# Patient Record
Sex: Female | Born: 1960 | Race: Black or African American | Hispanic: No | Marital: Single | Smoking: Never smoker
Health system: Southern US, Community
[De-identification: ages and names within clinical notes are randomized; demographics above are authoritative.]

## PROBLEM LIST (undated history)

## (undated) DIAGNOSIS — G4733 Obstructive sleep apnea (adult) (pediatric): Secondary | ICD-10-CM

## (undated) DIAGNOSIS — E119 Type 2 diabetes mellitus without complications: Secondary | ICD-10-CM

## (undated) DIAGNOSIS — E669 Obesity, unspecified: Secondary | ICD-10-CM

## (undated) DIAGNOSIS — R911 Solitary pulmonary nodule: Secondary | ICD-10-CM

## (undated) DIAGNOSIS — E063 Autoimmune thyroiditis: Secondary | ICD-10-CM

## (undated) DIAGNOSIS — R7303 Prediabetes: Secondary | ICD-10-CM

## (undated) DIAGNOSIS — E079 Disorder of thyroid, unspecified: Secondary | ICD-10-CM

## (undated) DIAGNOSIS — IMO0002 Reserved for concepts with insufficient information to code with codable children: Secondary | ICD-10-CM

## (undated) DIAGNOSIS — R42 Dizziness and giddiness: Secondary | ICD-10-CM

## (undated) DIAGNOSIS — E039 Hypothyroidism, unspecified: Secondary | ICD-10-CM

## (undated) HISTORY — DX: Reserved for concepts with insufficient information to code with codable children: IMO0002

## (undated) HISTORY — DX: Type 2 diabetes mellitus without complications: E11.9

## (undated) HISTORY — DX: Disorder of thyroid, unspecified: E07.9

## (undated) HISTORY — PX: TONSILLECTOMY: SUR1361

## (undated) HISTORY — DX: Autoimmune thyroiditis: E06.3

## (undated) HISTORY — DX: Morbid (severe) obesity due to excess calories: E66.01

---

## 1998-10-07 DIAGNOSIS — IMO0002 Reserved for concepts with insufficient information to code with codable children: Secondary | ICD-10-CM

## 1998-10-07 DIAGNOSIS — R87619 Unspecified abnormal cytological findings in specimens from cervix uteri: Secondary | ICD-10-CM

## 1998-10-07 HISTORY — DX: Reserved for concepts with insufficient information to code with codable children: IMO0002

## 1998-10-07 HISTORY — DX: Unspecified abnormal cytological findings in specimens from cervix uteri: R87.619

## 2002-03-02 HISTORY — PX: LAPAROSCOPIC TUBAL LIGATION: SUR803

## 2004-08-22 ENCOUNTER — Emergency Department: Payer: Self-pay | Admitting: Emergency Medicine

## 2005-10-01 ENCOUNTER — Ambulatory Visit: Payer: Self-pay | Admitting: Gynecology

## 2005-12-06 ENCOUNTER — Ambulatory Visit: Payer: Self-pay | Admitting: Family Medicine

## 2006-08-17 ENCOUNTER — Encounter: Payer: Self-pay | Admitting: Obstetrics & Gynecology

## 2006-08-17 ENCOUNTER — Ambulatory Visit: Payer: Self-pay | Admitting: Obstetrics & Gynecology

## 2007-11-02 ENCOUNTER — Ambulatory Visit: Payer: Self-pay | Admitting: Gynecology

## 2007-11-02 ENCOUNTER — Encounter (INDEPENDENT_AMBULATORY_CARE_PROVIDER_SITE_OTHER): Payer: Self-pay | Admitting: Gynecology

## 2008-01-18 ENCOUNTER — Ambulatory Visit: Payer: Self-pay

## 2008-11-14 ENCOUNTER — Ambulatory Visit: Payer: Self-pay | Admitting: Obstetrics & Gynecology

## 2008-11-14 ENCOUNTER — Encounter: Payer: Self-pay | Admitting: Obstetrics & Gynecology

## 2009-11-05 ENCOUNTER — Ambulatory Visit: Payer: Self-pay | Admitting: Obstetrics & Gynecology

## 2009-11-25 ENCOUNTER — Ambulatory Visit: Payer: Self-pay | Admitting: Obstetrics & Gynecology

## 2010-04-07 ENCOUNTER — Ambulatory Visit: Payer: Self-pay | Admitting: Obstetrics & Gynecology

## 2010-09-29 NOTE — Assessment & Plan Note (Signed)
NAME:  Krista Mclean, Krista Mclean NO.:  [id number]   MEDICAL RECORD NO.:  [id number]          PATIENT TYPE:  POB   LOCATION:  [facility] at [facility]         FACILITY:  [facility]   PHYSICIAN:  Ginger Carne, MD DATE OF BIRTH:  23-Nov-1960   DATE OF SERVICE:  11/02/2007                                  CLINIC NOTE   Krista Mclean is here today for routine gynecological evaluation.  She is a  multiparous African American female.  She had blood work performed at  her place of work, which demonstrated her TSH level to be 0.194, the  normal range is from 0.450-4.5.  Otherwise, her laboratory work appeared  normal apart from an elevated LDL of 109.  She states her menses are  regular.  No symptoms of urinary stress incontinence and she had a  normal mammogram 1 year ago.   CURRENT MEDICATIONS:  Levothyroxine 112 mcg 1 daily.  No change in her  medical history.  No cardiac, GI or GU complaints.   PHYSICAL EXAMINATION:  VITAL SIGNS:  Blood pressure 136/86, weight 163  pounds, height 5 feet 6 inches, pulse 76 and regular.  HEENT:  Grossly normal.  BREAST:  Without masses, discharge, thickenings or tenderness.  CHEST:  Clear to percussion and auscultation.  CARDIOVASCULAR:  Without murmurs or enlargements.  Regular rate and  rhythm.  EXTREMITY/LYMPHATIC/SKIN/NEUROLOGICAL/MUSCULOSKELETAL SYSTEMS:  Within  normal limits.  ABDOMEN:  Soft without gross hepatosplenomegaly.  PELVIC:  Pap smear performed.  EXTERNAL GENITALIA:  Vulva and vagina normal.  Cervix smooth without  erosions or lesions.  Uterus is normal-sized, anteverted and flexed.  Both adnexa are palpable and found to be normal.   IMPRESSION AND PLAN:  Levothyroxine will be reduced to 100 mcg daily and  repeat TSH in 6-8 weeks.  The patient understands further adjustments  may be necessary.  She will also be scheduled for a screening mammogram  in approximately 1 year.  No other recommendations at this time.  She  will return in  1 year for routine gynecological evaluation.           ______________________________  Ginger Carne, MD     SHB/MEDQ  D:  11/02/2007  T:  11/02/2007  Job:  [id number]

## 2010-09-29 NOTE — Assessment & Plan Note (Signed)
NAME:  Krista Mclean, Krista Mclean NO.:  [id number]   MEDICAL RECORD NO.:  [id number]          PATIENT TYPE:  POB   LOCATION:  [facility]         FACILITY:  [facility]   PHYSICIAN:  Scheryl Darter, MD       DATE OF BIRTH:  09/21/1960   DATE OF SERVICE:                                  CLINIC NOTE   The patient returns today for yearly exam.  The patient is a 50 year old  black female gravida 2, para 2, last menstrual period on November 02, 2008.  Her periods are regular and lasts for 4 days and are not heavy.  The  patient is status post tubal ligation.  She is in monogamous  relationship.   PAST MEDICAL HISTORY:  1. Hypothyroidism.  2. Morbid obesity.   PAST SURGICAL HISTORY:  1. Two cesarean sections and tubal ligation.  2. Tonsillectomy.   MEDICATIONS:  Levothyroxine 25 mcg p.o. daily.   ALLERGIES:  No known drug allergies.   SOCIAL HISTORY:  The patient is a single monogamous.  She denies  alcohol, tobacco, or drug use.   FAMILY HISTORY:  Negative for breast, gynecologic, or colon cancer.  There is a history of diabetes.   REVIEW OF SYSTEMS:  Irregular menses.  No complaints of pain, bowel, or  bladder complaints.   PHYSICAL EXAMINATION:  GENERAL:  The patient is in no acute distress and  affect is pleasant.  Weight is 262 pounds, height 5 feet and 6.  BMI is  greater than  42, blood pressure 129/78, and pulse is 78.  CHEST:  Clear.  HEART:  Regular rhythm.  BREASTS:  Without masses.  ABDOMEN:  Obese, soft, and nontender.  No mass.  EXTREMITIES:  No swelling.  PELVIC:  External genitalia, vagina, and cervix were normal.  No  abnormal discharge.  Pap was done.  Uterus is in normal size.  No  adnexal masses or tenderness.   IMPRESSION:  1. Normal gynecologic exam.  2. Morbid obesity.   PLAN:  I recommended increase exercise and dietary changes to have  weight loss.  I mentioned she is to try weight watchers.  She continues  to be followed by  endocrinologist for her hypothyroidism.  She will  schedule a screening mammogram.  The patient returns for yearly exams.      Scheryl Darter, MD     JA/MEDQ  D:  11/14/2008  T:  11/15/2008  Job:  [id number]

## 2010-09-29 NOTE — Assessment & Plan Note (Signed)
NAME:  Krista Mclean, Krista Mclean NO.:  [id number]   MEDICAL RECORD NO.:  [id number]          PATIENT TYPE:  POB   LOCATION:  [facility]         FACILITY:  [facility]   PHYSICIAN:  Scheryl Darter, MD       DATE OF BIRTH:  06/26/1960   DATE OF SERVICE:                                  CLINIC NOTE   The patient comes today for her yearly examination.  The patient is a 50-  year-old black female, gravida 2, para 2, last menstrual period November 12, 2009.  She has regular menses that last about 4 days and are not heavy.  She is status post tubal ligation.   PAST MEDICAL HISTORY:  1. Hypothyroidism.  2. Morbid obesity.   PAST SURGICAL HISTORY:  1. Two cesarean sections and tubal ligation.  2. Tonsillectomy.   MEDICATIONS:  Levothyroxine 125 mcg daily.  This is prescribed by her  family doctor.   No known drug allergies.   SOCIAL HISTORY:  The patient is single and monogamous.  She denies  alcohol, tobacco, or drug use.   FAMILY HISTORY:  Negative for breast, gynecologic, or colon cancer.  There is diabetes in the family.   REVIEW OF SYSTEMS:  Regular menses.  No complaints of chest pain,  shortness of breath.  She says that she only sleeps about 3-4 hours a  night and this is based on her habits rather than insomnia.  No bladder,  bowel, or vaginal complaints.   PHYSICAL EXAMINATION:  GENERAL:  No acute distress.  Normal affect.  VITAL SIGNS:  Weight is 253 pounds, height 5 feet 6 inches, blood  pressure is 131/82, pulse 81.  CHEST:  Clear.  HEART:  Regular rate and rhythm.  No thyromegaly noted.  BREASTS:  Symmetric.  No mass.  She has a bulging in both axillae which  appear to be fat pads.  No fluctuance or mass.  ABDOMEN:  Obese, soft, nontender.  No mass.  EXTREMITIES:  Some slight swelling below the knees.  PELVIC:  External genitalia, vagina and cervix appeared normal.  Pap was  done.  Uterus normal size.  No adnexal masses or tenderness.   IMPRESSION:  1. Normal gynecologic exam.  2. History of hypothyroidism.  3. Morbid obesity, poor health habits including lack of exercise and      inadequate sleep.   PLAN:  She is to have her TSH checked today and report about it to her  family doctor.  She will schedule screening mammogram.  Reassured her  there appeared to be no concerning findings on her breasts or axillary  exam.  She should continue to try to lose weight.  Recommend she  increase her sleep to at least 7 hours a night.  She should increase  exercise and decrease her dietary intake of calories.  She will return  for yearly exams.      Scheryl Darter, MD     JA/MEDQ  D:  11/25/2009  T:  11/25/2009  Job:  [id number]

## 2010-10-02 NOTE — Assessment & Plan Note (Signed)
[facility]                             [facility] OFFICE NOTE   NAME:Krista Mclean, Krista Mclean                          MRN:          [id number]  DATE:12/06/2005                            DOB:          [birth year]    Krista Mclean is a 50 year old black female who comes to establish with the  practice due to pain in the back of her head since November 25, 2005.  She was  seen at an urgent care on November 25, 2005, and was given Celebrex 200 mg daily  for 10 days, which has worked; however, she is running out of this.  She was  told that she had an inflamed nerve in her scalp.  This has continued,  though at a lesser intensity.  She was referred to this office through the  courtesy of Dr. Mia Creek.   She indicates that she sees Dr. Mia Creek on a regular basis and is up to  date with her Pap smear, with the most recent being May 2007, and mammogram  near the same time.   CURRENT MEDICATIONS:  1.  Synthroid 0.112 mg daily, which is regulated by Dr. Mia [place].  2.  Her p.r.n. Celebrex.   ALLERGIES:  No known drug allergies.   ALLERGIES:  None known.   PAST MEDICAL HISTORY:  Hypothyroidism.  She is unsure of her chicken pox  status.  She has had no other major illnesses.   SURGICAL HISTORY:  Tonsillectomy at age 89 and two C-sections.   She has been hospitalized for childbirth only.   SOCIAL HISTORY:  She is single and has two children, ages 57 and 13.  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  She has never  smoked, does not use alcohol or street drugs.   REVIEW OF SYSTEMS:  She denies any HEENT, cardiovascular, respiratory, GI or  GU problems.  She has an OSHA hearing test on a yearly basis, and these have  been negative.  She has also had negative PPD skin test years ago.  She had  HIV testing three years ago, which was negative at the time of her  pregnancy.  Gynecologically, she is a gravida 2, para 2.  She has had no  complications with her  pregnancies with the exception of the first being a  breech and therefore leading to the C-section.  MUSCULOSKELETAL:  She  indicates that she has pain in her hands, feet and knees.  Has had no  fractures.  No allergic rhinitis and no thromboses.   FAMILY HISTORY:  Father died between the ages of 38 and 58 due to a heart  problem.  She is unsure of what this was.  He had had open heart surgery,  was on dialysis and had RA.  Her mother is living at age 59 with diabetes,  hypertension, hyperlipidemia and RA.  She has two half-brothers, both living  and well, and four half-sisters, living and well.   For questions regarding the extended family, find that a paternal aunt has a  weak heart.  there  are no MIs or CVAs to her knowledge in the family.  She  has one aunt with hypertension, no other chronic disease noted in the  family.   IMMUNIZATION RECORD:  Her last tetanus was March 1999.  She has not had the  hepatitis B vaccine.   PHYSICAL EXAMINATION:  VITAL SIGNS:  Blood pressure was 119/93, temperature  98.4, pulse was 80, weight is 247 pounds.  Height 5 feet 5 inches.  GENERAL:  Obese black female in no acute distress.  HEENT:  TMs clear.  Posterior pharynx clear.  Nasal mucosa pink.  Facial  sinuses are nontender.  NECK:  No lymphadenopathy noted in the neck or the posterior scalp.  CHEST:  Clear throughout.  No rales, rhonchi or wheezes.  CARDIAC:  Rate and rhythm regular without murmurs, gallops or rubs.  MUSCULOSKELETAL:  Muscle mass is equal, upper and lower extremities.  Gait  and station within normal limits.  She has full range of motion of her neck  with minimal tenderness with forward flexion up the back of her neck and  onto the posterior scalp.  SKIN:  There are no lesions noted on the scalp.  NEUROLOGIC:  Cranial nerves II-XII intact as tested.  PSYCHIATRIC:  Oriented x3.  Verbalizes easily.  A good historian.   ASSESSMENT:  Scalp pain, probably muscular, extending into  the neck.   PLAN:  1.  She will complete the Celebrex that she has and then use either      ibuprofen 400 mg every four hours with food or Tylenol on a p.r.n.      basis.  Suspect that this is a short-lived problem.  2.  Obesity.  I have encouraged her to reduce her calories, increase her      exercise.  3.  Will see back on an as-needed basis.                                   Billie D. Bean, FNP                                Marne A. Tower, MD   BDB/MedQ  DD:  12/09/2005  DT:  12/09/2005  Job #:  [id number]

## 2010-10-09 ENCOUNTER — Encounter: Payer: Self-pay | Admitting: Podiatry

## 2010-12-01 ENCOUNTER — Ambulatory Visit (INDEPENDENT_AMBULATORY_CARE_PROVIDER_SITE_OTHER): Payer: PRIVATE HEALTH INSURANCE | Admitting: Family Medicine

## 2010-12-01 ENCOUNTER — Other Ambulatory Visit (HOSPITAL_COMMUNITY)
Admission: RE | Admit: 2010-12-01 | Discharge: 2010-12-01 | Disposition: A | Discharge status: Home / Self Care | Payer: PRIVATE HEALTH INSURANCE | Source: Ambulatory Visit | Attending: Family Medicine | Admitting: Family Medicine

## 2010-12-01 ENCOUNTER — Encounter: Payer: Self-pay | Admitting: Family Medicine

## 2010-12-01 VITALS — BP 121/83 | HR 74 | Ht 65.0 in | Wt 266.0 lb

## 2010-12-01 DIAGNOSIS — Z1272 Encounter for screening for malignant neoplasm of vagina: Secondary | ICD-10-CM

## 2010-12-01 DIAGNOSIS — Z01419 Encounter for gynecological examination (general) (routine) without abnormal findings: Secondary | ICD-10-CM | POA: Insufficient documentation

## 2010-12-01 DIAGNOSIS — E039 Hypothyroidism, unspecified: Secondary | ICD-10-CM | POA: Insufficient documentation

## 2010-12-01 DIAGNOSIS — E669 Obesity, unspecified: Secondary | ICD-10-CM

## 2010-12-01 DIAGNOSIS — M722 Plantar fascial fibromatosis: Secondary | ICD-10-CM | POA: Insufficient documentation

## 2010-12-01 NOTE — Patient Instructions (Signed)
Calorie Counting Diet A calorie counting diet requires you to eat the number of calories that are right for you during a day. Calories are the measurement of how much energy you get from the food you eat. Eating the right amount of calories is important for staying at a healthy weight. If you eat too many calories your body will store them as fat and you may gain weight. If you eat too few calories you may lose weight. Counting the number of calories that you eat during a day will help you to know if you're eating the right amount. A Registered Dietitian can determine how many calories you need in a day. The amount of calories you need varies from person to person. If your goal is to lose weight you will need to eat fewer calories. Losing weight can benefit you if you are overweight or have health problems such as heart disease, high blood pressure or diabetes. If your goal is to gain weight, you will need to eat more calories. Gaining weight may be necessary if you have a certain health problem that causes your body to need more energy. TIPS Whether you are increasing or decreasing the number of calories you eat during a day, it may be hard to get used to changing what you eat and drink. The following are tips to help you keep track of the number of calories you are eating.  Measuring foods at home with measuring cups will help you to know the actual amount of food and number of calories you are eating.   Restaurants serve food in all different portion sizes. It is common that restaurants will serve food in amounts worth 2 or more serving sizes. While eating out, it may be helpful to estimate how many servings of a food you are given. For example, a serving of cooked rice is 1/2 cup and that is the size of half of a fist. Knowing serving sizes will help you have a better idea of how much food you are eating at restaurants.   Ask for smaller portion sizes or child-size portions at restaurants.   Plan  to eat half of a meal at a restaurant and take the rest home or share the other half with a friend   Read food labels for calorie content and serving size   Most packaged food has a Nutrition Facts Panel on its side or back. Here you can find out how many servings are in a package, the size of a serving, and the number of calories each serving has.   The serving size and number of servings per container are listed right below the Nutrition Facts heading. Just below the serving information, the number of calories in each serving is listed.   For example, say that a package has three cookies inside. The Nutrition Facts panel says that one serving is one cookie. Below that, it says that there are three servings in the container. The calories section of the Nutrition Facts says there are 90 calories. That means that there are 90 calories in one cookie. If you eat one cookie you have eaten 90 calories. If you eat all three cookies, you have eaten three times that amount, or 270 calories.  The list below tells you how big or small some common portion sizes are.  1 ounce (oz).................4 stacked dice.   3 oz.............................Marland KitchenDeck of cards.   1 teaspoon (tsp)..........Marland KitchenTip of little finger.   1 tablespoon (Tbsp).Marland KitchenMarland KitchenMarland KitchenTip of thumb.  2 Tbsp.........................Marland KitchenGolf ball.    Cup.........................Marland KitchenHalf of a fist.   1 Cup..........................Marland KitchenA fist.  KEEP A FOOD LOG Write down every food item that you eat, how much of the food you eat, and the number of calories in each food that you eat during the day. At the end of the day or throughout the day you can add up the total number of calories you have eaten.  It may help to set up a list like the one below. Find out the calorie information by reading food labels.  Breakfast   Bran Flakes (1 cup, 110 calories).   Fat free milk ( cup, 45 calories).   Snack   Apple (1 medium, 80 calories).   Lunch   Spinach  (1 cup, 20 calories).   Tomato ( medium, 20 calories).   Chicken breast strips (3 oz, 165 calories).   Shredded cheddar cheese ( cup, 110 calories).   Light [place] dressing (2 Tbsp, 60 calories).   Whole wheat bread (1 slice, 80 calories).   Tub margarine (1 tsp, 35 calories).   Vegetable soup (1 cup, 160 calories).   Dinner   Pork chop (3 oz, 190 calories).   Brown rice (1 cup, 215 calories).   Steamed broccoli ( cup, 20 calories).   Strawberries (1  cup, 65 calories).   Whipped cream (1 Tbsp, 50 calories).  Daily Calorie Total: 1425 Information from www.eatright.org, Foodwise Nutritional Analysis Database. Document Released: 05/03/2005 Document Re-Released: 05/25/2009 [facility] Patient Information 2011 [place], [place].Obesity Obesity is defined as having a Body Mass Index (BMI) of 30 or more. To calculate your BMI divide your weight in pounds by your height in inches squared and multiply that product by 703. Major illnesses resulting from long-term obesity include:  Stroke.   Heart disease.   Diabetes.   Many cancers.   Arthritis.  Obesity also complicates recovery from many other medical problems.  CAUSES  A history of obesity in your parents.   Thyroid hormone imbalance.   Environmental factors such as excess calorie intake and physical inactivity.  TREATMENT A healthy weight loss program includes:  A calorie restricted diet based on individual calorie needs.   Increased physical activity (exercise).  An exercise program is just as important as the right low calorie diet.  Weight-loss medicines should be used only under the supervision of your physician. These medicines help, but only if they are used with diet and exercise programs. Medicines can have side effects including nervousness, nausea, abdominal pain, diarrhea, headache, drowsiness, and depression.  An unhealthy weight loss program includes:  Fasting.   Fad diets.   Supplements and  drugs.  These choices do not succeed in long-term weight control.  HOME CARE INSTRUCTIONS To help you make the needed dietary changes:   Keep a daily record of everything you eat. There are many free websites to help you with this. It may be helpful to measure your foods so you can determine if you are eating the correct portion sizes.   Use low-calorie cookbooks or take special cooking classes.   Avoid alcohol. Drink more water and drinks with no calories.   Take vitamins and supplements only as recommended by your caregiver.   Weight-loss support groups, Optometrist, counselors, and stress reduction education can also be very helpful.  PREVENTION Losing weight and keeping it off takes time, discipline, a healthy diet and regular exercise. Document Released: 06/10/2004 Document Re-Released: 05/23/2007 [facility] Patient Information 2011 [place], [place].

## 2010-12-01 NOTE — Progress Notes (Signed)
  Subjective:     Krista Mclean is a 50 y.o. female and is here for a comprehensive physical exam. The patient reports no problems.  History   Social History  . Marital Status: Single    Spouse Name: N/A    Number of Children: N/A  . Years of Education: N/A   Occupational History  . Not on file.   Social History Main Topics  . Smoking status: Never Smoker   . Smokeless tobacco: Not on file  . Alcohol Use: No  . Drug Use: No  . Sexually Active:    Other Topics Concern  . Not on file   Social History Narrative  . No narrative on file   Health Maintenance  Topic Date Due  . Pap Smear  05/30/1979  . Tetanus/tdap  05/29/1980    The following portions of the patient's history were reviewed and updated as appropriate: allergies, current medications, past family history, past medical history, past social history, past surgical history and problem list.  Review of Systems A comprehensive review of systems was negative.   Objective:    BP 121/83  Pulse 74  Ht 5\' 5"  (1.651 m)  Wt 266 lb (120.657 kg)  BMI 44.26 kg/m2  LMP 11/21/2010 General appearance: alert, cooperative and appears stated age Head: Normocephalic, without obvious abnormality, atraumatic Neck: no adenopathy, supple, symmetrical, trachea midline and thyroid not enlarged, symmetric, no tenderness/mass/nodules Lungs: clear to auscultation bilaterally Breasts: normal appearance, no masses or tenderness, axillary breast tissue bilaterally Heart: regular rate and rhythm, S1, S2 normal, no murmur, click, rub or gallop Abdomen: soft, non-tender; bowel sounds normal; no masses,  no organomegaly Pelvic: cervix normal in appearance, external genitalia normal, no adnexal masses or tenderness, no cervical motion tenderness, uterus normal size, shape, and consistency and vagina normal without discharge Extremities: extremities normal, atraumatic, no cyanosis or edema Pulses: 2+ and symmetric Skin: Skin color, texture,  turgor normal. No rashes or lesions Lymph nodes: Cervical, supraclavicular, and axillary nodes normal. Neurologic: Grossly normal    Assessment:    Healthy female exam. Obesity  Plan:    Pap smear today. Mammogram in 12/12. Labs per work.  See After Visit Summary for Counseling Recommendations

## 2011-04-13 ENCOUNTER — Ambulatory Visit: Payer: Self-pay

## 2011-12-15 ENCOUNTER — Ambulatory Visit (INDEPENDENT_AMBULATORY_CARE_PROVIDER_SITE_OTHER): Payer: PRIVATE HEALTH INSURANCE | Admitting: Family Medicine

## 2011-12-15 ENCOUNTER — Encounter: Payer: Self-pay | Admitting: Family Medicine

## 2011-12-15 VITALS — BP 142/85 | HR 80 | Ht 66.0 in | Wt 264.0 lb

## 2011-12-15 DIAGNOSIS — Z124 Encounter for screening for malignant neoplasm of cervix: Secondary | ICD-10-CM

## 2011-12-15 DIAGNOSIS — Z01419 Encounter for gynecological examination (general) (routine) without abnormal findings: Secondary | ICD-10-CM

## 2011-12-15 DIAGNOSIS — Z1151 Encounter for screening for human papillomavirus (HPV): Secondary | ICD-10-CM

## 2011-12-15 NOTE — Progress Notes (Signed)
  Subjective:     Krista Mclean is a 51 y.o. female and is here for a comprehensive physical exam. The patient reports problems - some chest discomfort that she attributes to her job.  Marland Kitchen  History   Social History  . Marital Status: Single    Spouse Name: N/A    Number of Children: N/A  . Years of Education: N/A   Occupational History  . Not on file.   Social History Main Topics  . Smoking status: Never Smoker   . Smokeless tobacco: Not on file  . Alcohol Use: No  . Drug Use: No  . Sexually Active: Yes -- Female partner(s)    Birth Control/ Protection: Surgical   Other Topics Concern  . Not on file   Social History Narrative  . No narrative on file   Health Maintenance  Topic Date Due  . Pap Smear  05/30/1979  . Tetanus/tdap  05/29/1980  . Mammogram  05/30/2011  . Colonoscopy  05/30/2011  . Influenza Vaccine  02/15/2012    The following portions of the patient's history were reviewed and updated as appropriate: allergies, current medications, past family history, past medical history, past social history, past surgical history and problem list.  Review of Systems Pertinent items are noted in HPI.   Objective:    BP 142/85  Pulse 80  Ht 5\' 6"  (1.676 m)  Wt 264 lb (119.75 kg)  BMI 42.61 kg/m2  LMP 11/30/2011 General appearance: alert, cooperative and appears stated age Head: Normocephalic, without obvious abnormality, atraumatic Neck: no adenopathy, supple, symmetrical, trachea midline and thyroid not enlarged, symmetric, no tenderness/mass/nodules Lungs: clear to auscultation bilaterally Breasts: normal appearance, no masses or tenderness Heart: regular rate and rhythm, S1, S2 normal, no murmur, click, rub or gallop Abdomen: soft, non-tender; bowel sounds normal; no masses,  no organomegaly Pelvic: cervix normal in appearance, external genitalia normal, no adnexal masses or tenderness, no cervical motion tenderness, rectovaginal septum normal, uterus normal size, shape,  and consistency and vagina normal without discharge Extremities: extremities normal, atraumatic, no cyanosis or edema Pulses: 2+ and symmetric Skin: Skin color, texture, turgor normal. No rashes or lesions Lymph nodes: Cervical, supraclavicular, and axillary nodes normal. Neurologic: Grossly normal    Assessment:    Healthy female exam. BP mildly elevated.  To watch.  Blood work through other MD and job.     Plan:    Pap smear Mammogram already done Discussed colonoscopy screen and she will consider and let us know. See After Visit Summary for Counseling Recommendations

## 2011-12-15 NOTE — Patient Instructions (Signed)
Preventive Care for Adults, Female A healthy lifestyle and preventive care can promote health and wellness. Preventive health guidelines for women include the following key practices.  A routine yearly physical is a good way to check with your caregiver about your health and preventive screening. It is a chance to share any concerns and updates on your health, and to receive a thorough exam.   Visit your dentist for a routine exam and preventive care every 6 months. Brush your teeth twice a day and floss once a day. Good oral hygiene prevents tooth decay and gum disease.   The frequency of eye exams is based on your age, health, family medical history, use of contact lenses, and other factors. Follow your caregiver's recommendations for frequency of eye exams.   Eat a healthy diet. Foods like vegetables, fruits, whole grains, low-fat dairy products, and lean protein foods contain the nutrients you need without too many calories. Decrease your intake of foods high in solid fats, added sugars, and salt. Eat the right amount of calories for you.Get information about a proper diet from your caregiver, if necessary.   Regular physical exercise is one of the most important things you can do for your health. Most adults should get at least 150 minutes of moderate-intensity exercise (any activity that increases your heart rate and causes you to sweat) each week. In addition, most adults need muscle-strengthening exercises on 2 or more days a week.   Maintain a healthy weight. The body mass index (BMI) is a screening tool to identify possible weight problems. It provides an estimate of body fat based on height and weight. Your caregiver can help determine your BMI, and can help you achieve or maintain a healthy weight.For adults 20 years and older:   A BMI below 18.5 is considered underweight.   A BMI of 18.5 to 24.9 is normal.   A BMI of 25 to 29.9 is considered overweight.   A BMI of 30 and above is  considered obese.   Maintain normal blood lipids and cholesterol levels by exercising and minimizing your intake of saturated fat. Eat a balanced diet with plenty of fruit and vegetables. Blood tests for lipids and cholesterol should begin at age 20 and be repeated every 5 years. If your lipid or cholesterol levels are high, you are over 50, or you are at high risk for heart disease, you may need your cholesterol levels checked more frequently.Ongoing high lipid and cholesterol levels should be treated with medicines if diet and exercise are not effective.   If you smoke, find out from your caregiver how to quit. If you do not use tobacco, do not start.   If you are pregnant, do not drink alcohol. If you are breastfeeding, be very cautious about drinking alcohol. If you are not pregnant and choose to drink alcohol, do not exceed 1 drink per day. One drink is considered to be 12 ounces (355 mL) of beer, 5 ounces (148 mL) of wine, or 1.5 ounces (44 mL) of liquor.   Avoid use of street drugs. Do not share needles with anyone. Ask for help if you need support or instructions about stopping the use of drugs.   High blood pressure causes heart disease and increases the risk of stroke. Your blood pressure should be checked at least every 1 to 2 years. Ongoing high blood pressure should be treated with medicines if weight loss and exercise are not effective.   If you are 55 to 50   years old, ask your caregiver if you should take aspirin to prevent strokes.   Diabetes screening involves taking a blood sample to check your fasting blood sugar level. This should be done once every 3 years, after age 45, if you are within normal weight and without risk factors for diabetes. Testing should be considered at a younger age or be carried out more frequently if you are overweight and have at least 1 risk factor for diabetes.   Breast cancer screening is essential preventive care for women. You should practice "breast  self-awareness." This means understanding the normal appearance and feel of your breasts and may include breast self-examination. Any changes detected, no matter how small, should be reported to a caregiver. Women in their 20s and 30s should have a clinical breast exam (CBE) by a caregiver as part of a regular health exam every 1 to 3 years. After age 40, women should have a CBE every year. Starting at age 40, women should consider having a mammography (breast X-ray test) every year. Women who have a family history of breast cancer should talk to their caregiver about genetic screening. Women at a high risk of breast cancer should talk to their caregivers about having magnetic resonance imaging (MRI) and a mammography every year.   The Pap test is a screening test for cervical cancer. A Pap test can show cell changes on the cervix that might become cervical cancer if left untreated. A Pap test is a procedure in which cells are obtained and examined from the lower end of the uterus (cervix).   Women should have a Pap test starting at age 21.   Between ages 21 and 29, Pap tests should be repeated every 2 years.   Beginning at age 30, you should have a Pap test every 3 years as long as the past 3 Pap tests have been normal.   Some women have medical problems that increase the chance of getting cervical cancer. Talk to your caregiver about these problems. It is especially important to talk to your caregiver if a new problem develops soon after your last Pap test. In these cases, your caregiver may recommend more frequent screening and Pap tests.   The above recommendations are the same for women who have or have not gotten the vaccine for human papillomavirus (HPV).   If you had a hysterectomy for a problem that was not cancer or a condition that could lead to cancer, then you no longer need Pap tests. Even if you no longer need a Pap test, a regular exam is a good idea to make sure no other problems are  starting.   If you are between ages 65 and 70, and you have had normal Pap tests going back 10 years, you no longer need Pap tests. Even if you no longer need a Pap test, a regular exam is a good idea to make sure no other problems are starting.   If you have had past treatment for cervical cancer or a condition that could lead to cancer, you need Pap tests and screening for cancer for at least 20 years after your treatment.   If Pap tests have been discontinued, risk factors (such as a new sexual partner) need to be reassessed to determine if screening should be resumed.   The HPV test is an additional test that may be used for cervical cancer screening. The HPV test looks for the virus that can cause the cell changes on the cervix.   The cells collected during the Pap test can be tested for HPV. The HPV test could be used to screen women aged 30 years and older, and should be used in women of any age who have unclear Pap test results. After the age of 30, women should have HPV testing at the same frequency as a Pap test.   Colorectal cancer can be detected and often prevented. Most routine colorectal cancer screening begins at the age of 50 and continues through age 75. However, your caregiver may recommend screening at an earlier age if you have risk factors for colon cancer. On a yearly basis, your caregiver may provide home test kits to check for hidden blood in the stool. Use of a small camera at the end of a tube, to directly examine the colon (sigmoidoscopy or colonoscopy), can detect the earliest forms of colorectal cancer. Talk to your caregiver about this at age 50, when routine screening begins. Direct examination of the colon should be repeated every 5 to 10 years through age 75, unless early forms of pre-cancerous polyps or small growths are found.   Hepatitis C blood testing is recommended for all people born from 1945 through 1965 and any individual with known risks for hepatitis C.    Practice safe sex. Use condoms and avoid high-risk sexual practices to reduce the spread of sexually transmitted infections (STIs). STIs include gonorrhea, chlamydia, syphilis, trichomonas, herpes, HPV, and human immunodeficiency virus (HIV). Herpes, HIV, and HPV are viral illnesses that have no cure. They can result in disability, cancer, and death. Sexually active women aged 25 and younger should be checked for chlamydia. Older women with new or multiple partners should also be tested for chlamydia. Testing for other STIs is recommended if you are sexually active and at increased risk.   Osteoporosis is a disease in which the bones lose minerals and strength with aging. This can result in serious bone fractures. The risk of osteoporosis can be identified using a bone density scan. Women ages 65 and over and women at risk for fractures or osteoporosis should discuss screening with their caregivers. Ask your caregiver whether you should take a calcium supplement or vitamin D to reduce the rate of osteoporosis.   Menopause can be associated with physical symptoms and risks. Hormone replacement therapy is available to decrease symptoms and risks. You should talk to your caregiver about whether hormone replacement therapy is right for you.   Use sunscreen with sun protection factor (SPF) of 30 or more. Apply sunscreen liberally and repeatedly throughout the day. You should seek shade when your shadow is shorter than you. Protect yourself by wearing long sleeves, pants, a wide-brimmed hat, and sunglasses year round, whenever you are outdoors.   Once a month, do a whole body skin exam, using a mirror to look at the skin on your back. Notify your caregiver of new moles, moles that have irregular borders, moles that are larger than a pencil eraser, or moles that have changed in shape or color.   Stay current with required immunizations.   Influenza. You need a dose every fall (or winter). The composition of  the flu vaccine changes each year, so being vaccinated once is not enough.   Pneumococcal polysaccharide. You need 1 to 2 doses if you smoke cigarettes or if you have certain chronic medical conditions. You need 1 dose at age 65 (or older) if you have never been vaccinated.   Tetanus, diphtheria, pertussis (Tdap, Td). Get 1 dose of   Tdap vaccine if you are younger than age 65, are over 65 and have contact with an infant, are a healthcare worker, are pregnant, or simply want to be protected from whooping cough. After that, you need a Td booster dose every 10 years. Consult your caregiver if you have not had at least 3 tetanus and diphtheria-containing shots sometime in your life or have a deep or dirty wound.   HPV. You need this vaccine if you are a woman age 26 or younger. The vaccine is given in 3 doses over 6 months.   Measles, mumps, rubella (MMR). You need at least 1 dose of MMR if you were born in 1957 or later. You may also need a second dose.   Meningococcal. If you are age 19 to 21 and a first-year college student living in a residence hall, or have one of several medical conditions, you need to get vaccinated against meningococcal disease. You may also need additional booster doses.   Zoster (shingles). If you are age 60 or older, you should get this vaccine.   Varicella (chickenpox). If you have never had chickenpox or you were vaccinated but received only 1 dose, talk to your caregiver to find out if you need this vaccine.   Hepatitis A. You need this vaccine if you have a specific risk factor for hepatitis A virus infection or you simply wish to be protected from this disease. The vaccine is usually given as 2 doses, 6 to 18 months apart.   Hepatitis B. You need this vaccine if you have a specific risk factor for hepatitis B virus infection or you simply wish to be protected from this disease. The vaccine is given in 3 doses, usually over 6 months.  Preventive Services /  Frequency Ages 19 to 39  Blood pressure check.** / Every 1 to 2 years.   Lipid and cholesterol check.** / Every 5 years beginning at age 20.   Clinical breast exam.** / Every 3 years for women in their 20s and 30s.   Pap test.** / Every 2 years from ages 21 through 29. Every 3 years starting at age 30 through age 65 or 70 with a history of 3 consecutive normal Pap tests.   HPV screening.** / Every 3 years from ages 30 through ages 65 to 70 with a history of 3 consecutive normal Pap tests.   Hepatitis C blood test.** / For any individual with known risks for hepatitis C.   Skin self-exam. / Monthly.   Influenza immunization.** / Every year.   Pneumococcal polysaccharide immunization.** / 1 to 2 doses if you smoke cigarettes or if you have certain chronic medical conditions.   Tetanus, diphtheria, pertussis (Tdap, Td) immunization. / A one-time dose of Tdap vaccine. After that, you need a Td booster dose every 10 years.   HPV immunization. / 3 doses over 6 months, if you are 26 and younger.   Measles, mumps, rubella (MMR) immunization. / You need at least 1 dose of MMR if you were born in 1957 or later. You may also need a second dose.   Meningococcal immunization. / 1 dose if you are age 19 to 21 and a first-year college student living in a residence hall, or have one of several medical conditions, you need to get vaccinated against meningococcal disease. You may also need additional booster doses.   Varicella immunization.** / Consult your caregiver.   Hepatitis A immunization.** / Consult your caregiver. 2 doses, 6 to 18 months   apart.   Hepatitis B immunization.** / Consult your caregiver. 3 doses usually over 6 months.  Ages 40 to 64  Blood pressure check.** / Every 1 to 2 years.   Lipid and cholesterol check.** / Every 5 years beginning at age 20.   Clinical breast exam.** / Every year after age 40.   Mammogram.** / Every year beginning at age 40 and continuing for as  long as you are in good health. Consult with your caregiver.   Pap test.** / Every 3 years starting at age 30 through age 65 or 70 with a history of 3 consecutive normal Pap tests.   HPV screening.** / Every 3 years from ages 30 through ages 65 to 70 with a history of 3 consecutive normal Pap tests.   Fecal occult blood test (FOBT) of stool. / Every year beginning at age 50 and continuing until age 75. You may not need to do this test if you get a colonoscopy every 10 years.   Flexible sigmoidoscopy or colonoscopy.** / Every 5 years for a flexible sigmoidoscopy or every 10 years for a colonoscopy beginning at age 50 and continuing until age 75.   Hepatitis C blood test.** / For all people born from 1945 through 1965 and any individual with known risks for hepatitis C.   Skin self-exam. / Monthly.   Influenza immunization.** / Every year.   Pneumococcal polysaccharide immunization.** / 1 to 2 doses if you smoke cigarettes or if you have certain chronic medical conditions.   Tetanus, diphtheria, pertussis (Tdap, Td) immunization.** / A one-time dose of Tdap vaccine. After that, you need a Td booster dose every 10 years.   Measles, mumps, rubella (MMR) immunization. / You need at least 1 dose of MMR if you were born in 1957 or later. You may also need a second dose.   Varicella immunization.** / Consult your caregiver.   Meningococcal immunization.** / Consult your caregiver.   Hepatitis A immunization.** / Consult your caregiver. 2 doses, 6 to 18 months apart.   Hepatitis B immunization.** / Consult your caregiver. 3 doses, usually over 6 months.  Ages 65 and over  Blood pressure check.** / Every 1 to 2 years.   Lipid and cholesterol check.** / Every 5 years beginning at age 20.   Clinical breast exam.** / Every year after age 40.   Mammogram.** / Every year beginning at age 40 and continuing for as long as you are in good health. Consult with your caregiver.   Pap test.** /  Every 3 years starting at age 30 through age 65 or 70 with a 3 consecutive normal Pap tests. Testing can be stopped between 65 and 70 with 3 consecutive normal Pap tests and no abnormal Pap or HPV tests in the past 10 years.   HPV screening.** / Every 3 years from ages 30 through ages 65 or 70 with a history of 3 consecutive normal Pap tests. Testing can be stopped between 65 and 70 with 3 consecutive normal Pap tests and no abnormal Pap or HPV tests in the past 10 years.   Fecal occult blood test (FOBT) of stool. / Every year beginning at age 50 and continuing until age 75. You may not need to do this test if you get a colonoscopy every 10 years.   Flexible sigmoidoscopy or colonoscopy.** / Every 5 years for a flexible sigmoidoscopy or every 10 years for a colonoscopy beginning at age 50 and continuing until age 75.   Hepatitis   C blood test.** / For all people born from 1945 through 1965 and any individual with known risks for hepatitis C.   Osteoporosis screening.** / A one-time screening for women ages 65 and over and women at risk for fractures or osteoporosis.   Skin self-exam. / Monthly.   Influenza immunization.** / Every year.   Pneumococcal polysaccharide immunization.** / 1 dose at age 65 (or older) if you have never been vaccinated.   Tetanus, diphtheria, pertussis (Tdap, Td) immunization. / A one-time dose of Tdap vaccine if you are over 65 and have contact with an infant, are a healthcare worker, or simply want to be protected from whooping cough. After that, you need a Td booster dose every 10 years.   Varicella immunization.** / Consult your caregiver.   Meningococcal immunization.** / Consult your caregiver.   Hepatitis A immunization.** / Consult your caregiver. 2 doses, 6 to 18 months apart.   Hepatitis B immunization.** / Check with your caregiver. 3 doses, usually over 6 months.  ** Family history and personal history of risk and conditions may change your caregiver's  recommendations. Document Released: 06/29/2001 Document Revised: 04/22/2011 Document Reviewed: 09/28/2010 [employer] Patient Information 2012 [employer]. 

## 2012-04-20 ENCOUNTER — Ambulatory Visit: Payer: Self-pay

## 2012-05-22 ENCOUNTER — Encounter: Payer: Self-pay | Admitting: Family Medicine

## 2012-11-08 ENCOUNTER — Ambulatory Visit: Payer: Self-pay | Admitting: Otolaryngology

## 2012-12-14 ENCOUNTER — Encounter: Payer: Self-pay | Admitting: Obstetrics and Gynecology

## 2012-12-14 ENCOUNTER — Ambulatory Visit (INDEPENDENT_AMBULATORY_CARE_PROVIDER_SITE_OTHER): Payer: PRIVATE HEALTH INSURANCE | Admitting: Obstetrics and Gynecology

## 2012-12-14 VITALS — BP 150/97 | HR 70 | Wt 267.0 lb

## 2012-12-14 DIAGNOSIS — Z124 Encounter for screening for malignant neoplasm of cervix: Secondary | ICD-10-CM

## 2012-12-14 DIAGNOSIS — Z01419 Encounter for gynecological examination (general) (routine) without abnormal findings: Secondary | ICD-10-CM

## 2012-12-14 NOTE — Progress Notes (Signed)
  Subjective:     Krista Mclean is a 52 y.o. female and is here for a comprehensive physical exam. The patient reports no problems. She continues to have monthly menses q 28-30 days lasting 5-7 days. Patient is otherwise without complaints.  History   Social History  . Marital Status: Single    Spouse Name: N/A    Number of Children: N/A  . Years of Education: N/A   Occupational History  . Not on file.   Social History Main Topics  . Smoking status: Never Smoker   . Smokeless tobacco: Not on file  . Alcohol Use: No  . Drug Use: No  . Sexually Active: Yes -- Female partner(s)    Birth Control/ Protection: Surgical   Other Topics Concern  . Not on file   Social History Narrative  . No narrative on file   Health Maintenance  Topic Date Due  . Tetanus/tdap  05/29/1980  . Mammogram  05/30/2011  . Colonoscopy  05/30/2011  . Influenza Vaccine  01/15/2013  . Pap Smear  12/15/2014   Past Medical History  Diagnosis Date  . Thyroid disease   . Abnormal Pap smear 10/07/1998    mild dysplasia/leep done  . Morbid obesity    Past Surgical History  Procedure Laterality Date  . Cesarean section  1993  . Cesarean section  03/02/2002    ELECTIVE  . Laparoscopic tubal ligation  03/02/2002  . Tonsillectomy     Family History  Problem Relation Age of Onset  . Diabetes Mother   . Arthritis Mother   . Arthritis Father   . Heart disease Son 68    CARDIO MYOPATHY  . Hypertension Maternal Grandmother    History  Substance Use Topics  . Smoking status: Never Smoker   . Smokeless tobacco: Not on file  . Alcohol Use: No       Review of Systems A comprehensive review of systems was negative.   Objective:      GENERAL: Well-developed, well-nourished female in no acute distress.  HEENT: Normocephalic, atraumatic. Sclerae anicteric.  NECK: Supple. Normal thyroid.  LUNGS: Clear to auscultation bilaterally.  HEART: Regular rate and rhythm. BREASTS: Symmetric in size. No  palpable masses or lymphadenopathy, skin changes, or nipple drainage. ABDOMEN: Soft, nontender, nondistended. No organomegaly. PELVIC: Normal external female genitalia. Vagina is pink and rugated.  Normal discharge. Normal appearing cervix. Uterus is normal in size. No adnexal mass or tenderness. EXTREMITIES: No cyanosis, clubbing, or edema, 2+ distal pulses.    Assessment:    Healthy female exam.      Plan:    pap smear collected Screening mammogram done in October 2013 per patient report with work- normal  Colonoscopy referral provided Patient advised to perform monthly self breast and vulva exams RTC in 1 year or prn Patient will be contacted with any abnormal results See After Visit Summary for Counseling Recommendations

## 2012-12-14 NOTE — Patient Instructions (Signed)
Preventive Care for Adults, Female A healthy lifestyle and preventive care can promote health and wellness. Preventive health guidelines for women include the following key practices.  A routine yearly physical is a good way to check with your caregiver about your health and preventive screening. It is a chance to share any concerns and updates on your health, and to receive a thorough exam.  Visit your dentist for a routine exam and preventive care every 6 months. Brush your teeth twice a day and floss once a day. Good oral hygiene prevents tooth decay and gum disease.  The frequency of eye exams is based on your age, health, family medical history, use of contact lenses, and other factors. Follow your caregiver's recommendations for frequency of eye exams.  Eat a healthy diet. Foods like vegetables, fruits, whole grains, low-fat dairy products, and lean protein foods contain the nutrients you need without too many calories. Decrease your intake of foods high in solid fats, added sugars, and salt. Eat the right amount of calories for you.Get information about a proper diet from your caregiver, if necessary.  Regular physical exercise is one of the most important things you can do for your health. Most adults should get at least 150 minutes of moderate-intensity exercise (any activity that increases your heart rate and causes you to sweat) each week. In addition, most adults need muscle-strengthening exercises on 2 or more days a week.  Maintain a healthy weight. The body mass index (BMI) is a screening tool to identify possible weight problems. It provides an estimate of body fat based on height and weight. Your caregiver can help determine your BMI, and can help you achieve or maintain a healthy weight.For adults 20 years and older:  A BMI below 18.5 is considered underweight.  A BMI of 18.5 to 24.9 is normal.  A BMI of 25 to 29.9 is considered overweight.  A BMI of 30 and above is  considered obese.  Maintain normal blood lipids and cholesterol levels by exercising and minimizing your intake of saturated fat. Eat a balanced diet with plenty of fruit and vegetables. Blood tests for lipids and cholesterol should begin at age 20 and be repeated every 5 years. If your lipid or cholesterol levels are high, you are over 50, or you are at high risk for heart disease, you may need your cholesterol levels checked more frequently.Ongoing high lipid and cholesterol levels should be treated with medicines if diet and exercise are not effective.  If you smoke, find out from your caregiver how to quit. If you do not use tobacco, do not start.  If you are pregnant, do not drink alcohol. If you are breastfeeding, be very cautious about drinking alcohol. If you are not pregnant and choose to drink alcohol, do not exceed 1 drink per day. One drink is considered to be 12 ounces (355 mL) of beer, 5 ounces (148 mL) of wine, or 1.5 ounces (44 mL) of liquor.  Avoid use of street drugs. Do not share needles with anyone. Ask for help if you need support or instructions about stopping the use of drugs.  High blood pressure causes heart disease and increases the risk of stroke. Your blood pressure should be checked at least every 1 to 2 years. Ongoing high blood pressure should be treated with medicines if weight loss and exercise are not effective.  If you are 55 to 51 years old, ask your caregiver if you should take aspirin to prevent strokes.  Diabetes   screening involves taking a blood sample to check your fasting blood sugar level. This should be done once every 3 years, after age 45, if you are within normal weight and without risk factors for diabetes. Testing should be considered at a younger age or be carried out more frequently if you are overweight and have at least 1 risk factor for diabetes.  Breast cancer screening is essential preventive care for women. You should practice "breast  self-awareness." This means understanding the normal appearance and feel of your breasts and may include breast self-examination. Any changes detected, no matter how small, should be reported to a caregiver. Women in their 20s and 30s should have a clinical breast exam (CBE) by a caregiver as part of a regular health exam every 1 to 3 years. After age 40, women should have a CBE every year. Starting at age 40, women should consider having a mammography (breast X-ray test) every year. Women who have a family history of breast cancer should talk to their caregiver about genetic screening. Women at a high risk of breast cancer should talk to their caregivers about having magnetic resonance imaging (MRI) and a mammography every year.  The Pap test is a screening test for cervical cancer. A Pap test can show cell changes on the cervix that might become cervical cancer if left untreated. A Pap test is a procedure in which cells are obtained and examined from the lower end of the uterus (cervix).  Women should have a Pap test starting at age 21.  Between ages 21 and 29, Pap tests should be repeated every 2 years.  Beginning at age 30, you should have a Pap test every 3 years as long as the past 3 Pap tests have been normal.  Some women have medical problems that increase the chance of getting cervical cancer. Talk to your caregiver about these problems. It is especially important to talk to your caregiver if a new problem develops soon after your last Pap test. In these cases, your caregiver may recommend more frequent screening and Pap tests.  The above recommendations are the same for women who have or have not gotten the vaccine for human papillomavirus (HPV).  If you had a hysterectomy for a problem that was not cancer or a condition that could lead to cancer, then you no longer need Pap tests. Even if you no longer need a Pap test, a regular exam is a good idea to make sure no other problems are  starting.  If you are between ages 65 and 70, and you have had normal Pap tests going back 10 years, you no longer need Pap tests. Even if you no longer need a Pap test, a regular exam is a good idea to make sure no other problems are starting.  If you have had past treatment for cervical cancer or a condition that could lead to cancer, you need Pap tests and screening for cancer for at least 20 years after your treatment.  If Pap tests have been discontinued, risk factors (such as a new sexual partner) need to be reassessed to determine if screening should be resumed.  The HPV test is an additional test that may be used for cervical cancer screening. The HPV test looks for the virus that can cause the cell changes on the cervix. The cells collected during the Pap test can be tested for HPV. The HPV test could be used to screen women aged 30 years and older, and should   be used in women of any age who have unclear Pap test results. After the age of 30, women should have HPV testing at the same frequency as a Pap test.  Colorectal cancer can be detected and often prevented. Most routine colorectal cancer screening begins at the age of 50 and continues through age 75. However, your caregiver may recommend screening at an earlier age if you have risk factors for colon cancer. On a yearly basis, your caregiver may provide home test kits to check for hidden blood in the stool. Use of a small camera at the end of a tube, to directly examine the colon (sigmoidoscopy or colonoscopy), can detect the earliest forms of colorectal cancer. Talk to your caregiver about this at age 50, when routine screening begins. Direct examination of the colon should be repeated every 5 to 10 years through age 75, unless early forms of pre-cancerous polyps or small growths are found.  Hepatitis C blood testing is recommended for all people born from 1945 through 1965 and any individual with known risks for hepatitis C.  Practice  safe sex. Use condoms and avoid high-risk sexual practices to reduce the spread of sexually transmitted infections (STIs). STIs include gonorrhea, chlamydia, syphilis, trichomonas, herpes, HPV, and human immunodeficiency virus (HIV). Herpes, HIV, and HPV are viral illnesses that have no cure. They can result in disability, cancer, and death. Sexually active women aged 25 and younger should be checked for chlamydia. Older women with new or multiple partners should also be tested for chlamydia. Testing for other STIs is recommended if you are sexually active and at increased risk.  Osteoporosis is a disease in which the bones lose minerals and strength with aging. This can result in serious bone fractures. The risk of osteoporosis can be identified using a bone density scan. Women ages 65 and over and women at risk for fractures or osteoporosis should discuss screening with their caregivers. Ask your caregiver whether you should take a calcium supplement or vitamin D to reduce the rate of osteoporosis.  Menopause can be associated with physical symptoms and risks. Hormone replacement therapy is available to decrease symptoms and risks. You should talk to your caregiver about whether hormone replacement therapy is right for you.  Use sunscreen with sun protection factor (SPF) of 30 or more. Apply sunscreen liberally and repeatedly throughout the day. You should seek shade when your shadow is shorter than you. Protect yourself by wearing long sleeves, pants, a wide-brimmed hat, and sunglasses year round, whenever you are outdoors.  Once a month, do a whole body skin exam, using a mirror to look at the skin on your back. Notify your caregiver of new moles, moles that have irregular borders, moles that are larger than a pencil eraser, or moles that have changed in shape or color.  Stay current with required immunizations.  Influenza. You need a dose every fall (or winter). The composition of the flu vaccine  changes each year, so being vaccinated once is not enough.  Pneumococcal polysaccharide. You need 1 to 2 doses if you smoke cigarettes or if you have certain chronic medical conditions. You need 1 dose at age 65 (or older) if you have never been vaccinated.  Tetanus, diphtheria, pertussis (Tdap, Td). Get 1 dose of Tdap vaccine if you are younger than age 65, are over 65 and have contact with an infant, are a healthcare worker, are pregnant, or simply want to be protected from whooping cough. After that, you need a Td   booster dose every 10 years. Consult your caregiver if you have not had at least 3 tetanus and diphtheria-containing shots sometime in your life or have a deep or dirty wound.  HPV. You need this vaccine if you are a woman age 26 or younger. The vaccine is given in 3 doses over 6 months.  Measles, mumps, rubella (MMR). You need at least 1 dose of MMR if you were born in 1957 or later. You may also need a second dose.  Meningococcal. If you are age 19 to 21 and a first-year college student living in a residence hall, or have one of several medical conditions, you need to get vaccinated against meningococcal disease. You may also need additional booster doses.  Zoster (shingles). If you are age 60 or older, you should get this vaccine.  Varicella (chickenpox). If you have never had chickenpox or you were vaccinated but received only 1 dose, talk to your caregiver to find out if you need this vaccine.  Hepatitis A. You need this vaccine if you have a specific risk factor for hepatitis A virus infection or you simply wish to be protected from this disease. The vaccine is usually given as 2 doses, 6 to 18 months apart.  Hepatitis B. You need this vaccine if you have a specific risk factor for hepatitis B virus infection or you simply wish to be protected from this disease. The vaccine is given in 3 doses, usually over 6 months. Preventive Services / Frequency Ages 19 to 39  Blood  pressure check.** / Every 1 to 2 years.  Lipid and cholesterol check.** / Every 5 years beginning at age 20.  Clinical breast exam.** / Every 3 years for women in their 20s and 30s.  Pap test.** / Every 2 years from ages 21 through 29. Every 3 years starting at age 30 through age 65 or 70 with a history of 3 consecutive normal Pap tests.  HPV screening.** / Every 3 years from ages 30 through ages 65 to 70 with a history of 3 consecutive normal Pap tests.  Hepatitis C blood test.** / For any individual with known risks for hepatitis C.  Skin self-exam. / Monthly.  Influenza immunization.** / Every year.  Pneumococcal polysaccharide immunization.** / 1 to 2 doses if you smoke cigarettes or if you have certain chronic medical conditions.  Tetanus, diphtheria, pertussis (Tdap, Td) immunization. / A one-time dose of Tdap vaccine. After that, you need a Td booster dose every 10 years.  HPV immunization. / 3 doses over 6 months, if you are 26 and younger.  Measles, mumps, rubella (MMR) immunization. / You need at least 1 dose of MMR if you were born in 1957 or later. You may also need a second dose.  Meningococcal immunization. / 1 dose if you are age 19 to 21 and a first-year college student living in a residence hall, or have one of several medical conditions, you need to get vaccinated against meningococcal disease. You may also need additional booster doses.  Varicella immunization.** / Consult your caregiver.  Hepatitis A immunization.** / Consult your caregiver. 2 doses, 6 to 18 months apart.  Hepatitis B immunization.** / Consult your caregiver. 3 doses usually over 6 months. Ages 40 to 64  Blood pressure check.** / Every 1 to 2 years.  Lipid and cholesterol check.** / Every 5 years beginning at age 20.  Clinical breast exam.** / Every year after age 40.  Mammogram.** / Every year beginning at age 40   and continuing for as long as you are in good health. Consult with your  caregiver.  Pap test.** / Every 3 years starting at age 30 through age 65 or 70 with a history of 3 consecutive normal Pap tests.  HPV screening.** / Every 3 years from ages 30 through ages 65 to 70 with a history of 3 consecutive normal Pap tests.  Fecal occult blood test (FOBT) of stool. / Every year beginning at age 50 and continuing until age 75. You may not need to do this test if you get a colonoscopy every 10 years.  Flexible sigmoidoscopy or colonoscopy.** / Every 5 years for a flexible sigmoidoscopy or every 10 years for a colonoscopy beginning at age 50 and continuing until age 75.  Hepatitis C blood test.** / For all people born from 1945 through 1965 and any individual with known risks for hepatitis C.  Skin self-exam. / Monthly.  Influenza immunization.** / Every year.  Pneumococcal polysaccharide immunization.** / 1 to 2 doses if you smoke cigarettes or if you have certain chronic medical conditions.  Tetanus, diphtheria, pertussis (Tdap, Td) immunization.** / A one-time dose of Tdap vaccine. After that, you need a Td booster dose every 10 years.  Measles, mumps, rubella (MMR) immunization. / You need at least 1 dose of MMR if you were born in 1957 or later. You may also need a second dose.  Varicella immunization.** / Consult your caregiver.  Meningococcal immunization.** / Consult your caregiver.  Hepatitis A immunization.** / Consult your caregiver. 2 doses, 6 to 18 months apart.  Hepatitis B immunization.** / Consult your caregiver. 3 doses, usually over 6 months. Ages 65 and over  Blood pressure check.** / Every 1 to 2 years.  Lipid and cholesterol check.** / Every 5 years beginning at age 20.  Clinical breast exam.** / Every year after age 40.  Mammogram.** / Every year beginning at age 40 and continuing for as long as you are in good health. Consult with your caregiver.  Pap test.** / Every 3 years starting at age 30 through age 65 or 70 with a 3  consecutive normal Pap tests. Testing can be stopped between 65 and 70 with 3 consecutive normal Pap tests and no abnormal Pap or HPV tests in the past 10 years.  HPV screening.** / Every 3 years from ages 30 through ages 65 or 70 with a history of 3 consecutive normal Pap tests. Testing can be stopped between 65 and 70 with 3 consecutive normal Pap tests and no abnormal Pap or HPV tests in the past 10 years.  Fecal occult blood test (FOBT) of stool. / Every year beginning at age 50 and continuing until age 75. You may not need to do this test if you get a colonoscopy every 10 years.  Flexible sigmoidoscopy or colonoscopy.** / Every 5 years for a flexible sigmoidoscopy or every 10 years for a colonoscopy beginning at age 50 and continuing until age 75.  Hepatitis C blood test.** / For all people born from 1945 through 1965 and any individual with known risks for hepatitis C.  Osteoporosis screening.** / A one-time screening for women ages 65 and over and women at risk for fractures or osteoporosis.  Skin self-exam. / Monthly.  Influenza immunization.** / Every year.  Pneumococcal polysaccharide immunization.** / 1 dose at age 65 (or older) if you have never been vaccinated.  Tetanus, diphtheria, pertussis (Tdap, Td) immunization. / A one-time dose of Tdap vaccine if you are over   65 and have contact with an infant, are a healthcare worker, or simply want to be protected from whooping cough. After that, you need a Td booster dose every 10 years.  Varicella immunization.** / Consult your caregiver.  Meningococcal immunization.** / Consult your caregiver.  Hepatitis A immunization.** / Consult your caregiver. 2 doses, 6 to 18 months apart.  Hepatitis B immunization.** / Check with your caregiver. 3 doses, usually over 6 months. ** Family history and personal history of risk and conditions may change your caregiver's recommendations. Document Released: 06/29/2001 Document Revised: 07/26/2011  Document Reviewed: 09/28/2010 [employer] Patient Information 2014 [employer].  

## 2013-04-24 ENCOUNTER — Ambulatory Visit: Payer: Self-pay | Admitting: *Deleted

## 2013-05-01 ENCOUNTER — Ambulatory Visit: Payer: Self-pay | Admitting: Specialist

## 2013-12-19 ENCOUNTER — Encounter: Payer: Self-pay | Admitting: Obstetrics and Gynecology

## 2013-12-19 ENCOUNTER — Ambulatory Visit (INDEPENDENT_AMBULATORY_CARE_PROVIDER_SITE_OTHER): Payer: PRIVATE HEALTH INSURANCE | Admitting: Obstetrics and Gynecology

## 2013-12-19 VITALS — BP 154/99 | HR 77 | Ht 66.0 in | Wt 258.6 lb

## 2013-12-19 DIAGNOSIS — Z124 Encounter for screening for malignant neoplasm of cervix: Secondary | ICD-10-CM

## 2013-12-19 DIAGNOSIS — Z1151 Encounter for screening for human papillomavirus (HPV): Secondary | ICD-10-CM

## 2013-12-19 DIAGNOSIS — Z01419 Encounter for gynecological examination (general) (routine) without abnormal findings: Secondary | ICD-10-CM

## 2013-12-19 NOTE — Progress Notes (Signed)
  Subjective:     Krista Mclean is a 53 y.o. female G2P2 with LMP 07/2013 and BMI 42 who is here for a comprehensive physical exam. The patient reports no problems. She is not sexually active. Patient denies any spotting or bleeding since her last period in march.   History   Social History  . Marital Status: Single    Spouse Name: N/A    Number of Children: N/A  . Years of Education: N/A   Occupational History  . Not on file.   Social History Main Topics  . Smoking status: Never Smoker   . Smokeless tobacco: Never Used  . Alcohol Use: No  . Drug Use: No  . Sexual Activity: Not Currently    Partners: Male    Birth Control/ Protection: Surgical   Other Topics Concern  . Not on file   Social History Narrative  . No narrative on file   Health Maintenance  Topic Date Due  . Tetanus/tdap  05/29/1980  . Mammogram  05/30/2011  . Colonoscopy  05/30/2011  . Influenza Vaccine  02/14/2014 (Originally 12/15/2013)  . Pap Smear  12/15/2015   Past Medical History  Diagnosis Date  . Thyroid disease   . Abnormal Pap smear 10/07/1998    mild dysplasia/leep done  . Morbid obesity    Past Surgical History  Procedure Laterality Date  . Cesarean section  1993  . Cesarean section  03/02/2002    ELECTIVE  . Laparoscopic tubal ligation  03/02/2002  . Tonsillectomy     Family History  Problem Relation Age of Onset  . Diabetes Mother   . Arthritis Mother   . Arthritis Father   . Heart disease Son 65    CARDIO MYOPATHY  . Hypertension Maternal Grandmother    History  Substance Use Topics  . Smoking status: Never Smoker   . Smokeless tobacco: Never Used  . Alcohol Use: No       Review of Systems A comprehensive review of systems was negative.   Objective:      GENERAL: Well-developed, well-nourished female in no acute distress.  HEENT: Normocephalic, atraumatic. Sclerae anicteric.  NECK: Supple. Normal thyroid.  LUNGS: Clear to auscultation bilaterally.  HEART: Regular  rate and rhythm. BREASTS: Symmetric in size. No palpable masses or lymphadenopathy, skin changes, or nipple drainage. ABDOMEN: Soft, nontender, nondistended. No organomegaly. PELVIC: Normal external female genitalia. Vagina is pink and rugated.  Normal discharge. Normal appearing cervix. Bimanual exam limited secondary to obesity. No adnexal mass or tenderness. EXTREMITIES: No cyanosis, clubbing, or edema, 2+ distal pulses.    Assessment:    Healthy female exam.      Plan:    pap smear collected Discussed elevated BP today. Patient denies a history of HTN. Advised to follow up with PCP for BP check and further management Discussed with patient weight loss management Patient receives free screening mammograms through work. Her next one is due in October. Her last one was reported as normal. Patient was asked to bring a copy for our records Patient advised to perform monthly self breast and vulva exams See After Visit Summary for Counseling Recommendations

## 2013-12-19 NOTE — Patient Instructions (Signed)
Preventive Care for Adults A healthy lifestyle and preventive care can promote health and wellness. Preventive health guidelines for women include the following key practices.  A routine yearly physical is a good way to check with your health care provider about your health and preventive screening. It is a chance to share any concerns and updates on your health and to receive a thorough exam.  Visit your dentist for a routine exam and preventive care every 6 months. Brush your teeth twice a day and floss once a day. Good oral hygiene prevents tooth decay and gum disease.  The frequency of eye exams is based on your age, health, family medical history, use of contact lenses, and other factors. Follow your health care provider's recommendations for frequency of eye exams.  Eat a healthy diet. Foods like vegetables, fruits, whole grains, low-fat dairy products, and lean protein foods contain the nutrients you need without too many calories. Decrease your intake of foods high in solid fats, added sugars, and salt. Eat the right amount of calories for you.Get information about a proper diet from your health care provider, if necessary.  Regular physical exercise is one of the most important things you can do for your health. Most adults should get at least 150 minutes of moderate-intensity exercise (any activity that increases your heart rate and causes you to sweat) each week. In addition, most adults need muscle-strengthening exercises on 2 or more days a week.  Maintain a healthy weight. The body mass index (BMI) is a screening tool to identify possible weight problems. It provides an estimate of body fat based on height and weight. Your health care provider can find your BMI and can help you achieve or maintain a healthy weight.For adults 20 years and older:  A BMI below 18.5 is considered underweight.  A BMI of 18.5 to 24.9 is normal.  A BMI of 25 to 29.9 is considered overweight.  A BMI of  30 and above is considered obese.  Maintain normal blood lipids and cholesterol levels by exercising and minimizing your intake of saturated fat. Eat a balanced diet with plenty of fruit and vegetables. Blood tests for lipids and cholesterol should begin at age 20 and be repeated every 5 years. If your lipid or cholesterol levels are high, you are over 50, or you are at high risk for heart disease, you may need your cholesterol levels checked more frequently.Ongoing high lipid and cholesterol levels should be treated with medicines if diet and exercise are not working.  If you smoke, find out from your health care provider how to quit. If you do not use tobacco, do not start.  Lung cancer screening is recommended for adults aged 55-80 years who are at high risk for developing lung cancer because of a history of smoking. A yearly low-dose CT scan of the lungs is recommended for people who have at least a 30-pack-year history of smoking and are a current smoker or have quit within the past 15 years. A pack year of smoking is smoking an average of 1 pack of cigarettes a day for 1 year (for example: 1 pack a day for 30 years or 2 packs a day for 15 years). Yearly screening should continue until the smoker has stopped smoking for at least 15 years. Yearly screening should be stopped for people who develop a health problem that would prevent them from having lung cancer treatment.  If you are pregnant, do not drink alcohol. If you are breastfeeding,   be very cautious about drinking alcohol. If you are not pregnant and choose to drink alcohol, do not have more than 1 drink per day. One drink is considered to be 12 ounces (355 mL) of beer, 5 ounces (148 mL) of wine, or 1.5 ounces (44 mL) of liquor.  Avoid use of street drugs. Do not share needles with anyone. Ask for help if you need support or instructions about stopping the use of drugs.  High blood pressure causes heart disease and increases the risk of  stroke. Your blood pressure should be checked at least every 1 to 2 years. Ongoing high blood pressure should be treated with medicines if weight loss and exercise do not work.  If you are 77-67 years old, ask your health care provider if you should take aspirin to prevent strokes.  Diabetes screening involves taking a blood sample to check your fasting blood sugar level. This should be done once every 3 years, after age 5, if you are within normal weight and without risk factors for diabetes. Testing should be considered at a younger age or be carried out more frequently if you are overweight and have at least 1 risk factor for diabetes.  Breast cancer screening is essential preventive care for women. You should practice "breast self-awareness." This means understanding the normal appearance and feel of your breasts and may include breast self-examination. Any changes detected, no matter how small, should be reported to a health care provider. Women in their 79s and 30s should have a clinical breast exam (CBE) by a health care provider as part of a regular health exam every 1 to 3 years. After age 46, women should have a CBE every year. Starting at age 25, women should consider having a mammogram (breast X-ray test) every year. Women who have a family history of breast cancer should talk to their health care provider about genetic screening. Women at a high risk of breast cancer should talk to their health care providers about having an MRI and a mammogram every year.  Breast cancer gene (BRCA)-related cancer risk assessment is recommended for women who have family members with BRCA-related cancers. BRCA-related cancers include breast, ovarian, tubal, and peritoneal cancers. Having family members with these cancers may be associated with an increased risk for harmful changes (mutations) in the breast cancer genes BRCA1 and BRCA2. Results of the assessment will determine the need for genetic counseling and  BRCA1 and BRCA2 testing.  Routine pelvic exams to screen for cancer are no longer recommended for nonpregnant women who are considered low risk for cancer of the pelvic organs (ovaries, uterus, and vagina) and who do not have symptoms. Ask your health care provider if a screening pelvic exam is right for you.  If you have had past treatment for cervical cancer or a condition that could lead to cancer, you need Pap tests and screening for cancer for at least 20 years after your treatment. If Pap tests have been discontinued, your risk factors (such as having a new sexual partner) need to be reassessed to determine if screening should be resumed. Some women have medical problems that increase the chance of getting cervical cancer. In these cases, your health care provider may recommend more frequent screening and Pap tests.  The HPV test is an additional test that may be used for cervical cancer screening. The HPV test looks for the virus that can cause the cell changes on the cervix. The cells collected during the Pap test can be  tested for HPV. The HPV test could be used to screen women aged 30 years and older, and should be used in women of any age who have unclear Pap test results. After the age of 30, women should have HPV testing at the same frequency as a Pap test.  Colorectal cancer can be detected and often prevented. Most routine colorectal cancer screening begins at the age of 50 years and continues through age 75 years. However, your health care provider may recommend screening at an earlier age if you have risk factors for colon cancer. On a yearly basis, your health care provider may provide home test kits to check for hidden blood in the stool. Use of a small camera at the end of a tube, to directly examine the colon (sigmoidoscopy or colonoscopy), can detect the earliest forms of colorectal cancer. Talk to your health care provider about this at age 50, when routine screening begins. Direct  exam of the colon should be repeated every 5-10 years through age 75 years, unless early forms of pre-cancerous polyps or small growths are found.  People who are at an increased risk for hepatitis B should be screened for this virus. You are considered at high risk for hepatitis B if:  You were born in a country where hepatitis B occurs often. Talk with your health care provider about which countries are considered high risk.  Your parents were born in a high-risk country and you have not received a shot to protect against hepatitis B (hepatitis B vaccine).  You have HIV or AIDS.  You use needles to inject street drugs.  You live with, or have sex with, someone who has hepatitis B.  You get hemodialysis treatment.  You take certain medicines for conditions like cancer, organ transplantation, and autoimmune conditions.  Hepatitis C blood testing is recommended for all people born from 1945 through 1965 and any individual with known risks for hepatitis C.  Practice safe sex. Use condoms and avoid high-risk sexual practices to reduce the spread of sexually transmitted infections (STIs). STIs include gonorrhea, chlamydia, syphilis, trichomonas, herpes, HPV, and human immunodeficiency virus (HIV). Herpes, HIV, and HPV are viral illnesses that have no cure. They can result in disability, cancer, and death.  You should be screened for sexually transmitted illnesses (STIs) including gonorrhea and chlamydia if:  You are sexually active and are younger than 24 years.  You are older than 24 years and your health care provider tells you that you are at risk for this type of infection.  Your sexual activity has changed since you were last screened and you are at an increased risk for chlamydia or gonorrhea. Ask your health care provider if you are at risk.  If you are at risk of being infected with HIV, it is recommended that you take a prescription medicine daily to prevent HIV infection. This is  called preexposure prophylaxis (PrEP). You are considered at risk if:  You are a heterosexual woman, are sexually active, and are at increased risk for HIV infection.  You take drugs by injection.  You are sexually active with a partner who has HIV.  Talk with your health care provider about whether you are at high risk of being infected with HIV. If you choose to begin PrEP, you should first be tested for HIV. You should then be tested every 3 months for as long as you are taking PrEP.  Osteoporosis is a disease in which the bones lose minerals and strength   with aging. This can result in serious bone fractures or breaks. The risk of osteoporosis can be identified using a bone density scan. Women ages 59 years and over and women at risk for fractures or osteoporosis should discuss screening with their health care providers. Ask your health care provider whether you should take a calcium supplement or vitamin D to reduce the rate of osteoporosis.  Menopause can be associated with physical symptoms and risks. Hormone replacement therapy is available to decrease symptoms and risks. You should talk to your health care provider about whether hormone replacement therapy is right for you.  Use sunscreen. Apply sunscreen liberally and repeatedly throughout the day. You should seek shade when your shadow is shorter than you. Protect yourself by wearing long sleeves, pants, a wide-brimmed hat, and sunglasses year round, whenever you are outdoors.  Once a month, do a whole body skin exam, using a mirror to look at the skin on your back. Tell your health care provider of new moles, moles that have irregular borders, moles that are larger than a pencil eraser, or moles that have changed in shape or color.  Stay current with required vaccines (immunizations).  Influenza vaccine. All adults should be immunized every year.  Tetanus, diphtheria, and acellular pertussis (Td, Tdap) vaccine. Pregnant women should  receive 1 dose of Tdap vaccine during each pregnancy. The dose should be obtained regardless of the length of time since the last dose. Immunization is preferred during the 27th-36th week of gestation. An adult who has not previously received Tdap or who does not know her vaccine status should receive 1 dose of Tdap. This initial dose should be followed by tetanus and diphtheria toxoids (Td) booster doses every 10 years. Adults with an unknown or incomplete history of completing a 3-dose immunization series with Td-containing vaccines should begin or complete a primary immunization series including a Tdap dose. Adults should receive a Td booster every 10 years.  Varicella vaccine. An adult without evidence of immunity to varicella should receive 2 doses or a second dose if she has previously received 1 dose. Pregnant females who do not have evidence of immunity should receive the first dose after pregnancy. This first dose should be obtained before leaving the health care facility. The second dose should be obtained 4-8 weeks after the first dose.  Human papillomavirus (HPV) vaccine. Females aged 13-26 years who have not received the vaccine previously should obtain the 3-dose series. The vaccine is not recommended for use in pregnant females. However, pregnancy testing is not needed before receiving a dose. If a female is found to be pregnant after receiving a dose, no treatment is needed. In that case, the remaining doses should be delayed until after the pregnancy. Immunization is recommended for any person with an immunocompromised condition through the age of 25 years if she did not get any or all doses earlier. During the 3-dose series, the second dose should be obtained 4-8 weeks after the first dose. The third dose should be obtained 24 weeks after the first dose and 16 weeks after the second dose.  Zoster vaccine. One dose is recommended for adults aged 22 years or older unless certain conditions are  present.  Measles, mumps, and rubella (MMR) vaccine. Adults born before [birth year] generally are considered immune to measles and mumps. Adults born in [birth year] or later should have 1 or more doses of MMR vaccine unless there is a contraindication to the vaccine or there is laboratory evidence of immunity to  each of the three diseases. A routine second dose of MMR vaccine should be obtained at least 28 days after the first dose for students attending postsecondary schools, health care workers, or international travelers. People who received inactivated measles vaccine or an unknown type of measles vaccine during 1963-1967 should receive 2 doses of MMR vaccine. People who received inactivated mumps vaccine or an unknown type of mumps vaccine before 1979 and are at high risk for mumps infection should consider immunization with 2 doses of MMR vaccine. For females of childbearing age, rubella immunity should be determined. If there is no evidence of immunity, females who are not pregnant should be vaccinated. If there is no evidence of immunity, females who are pregnant should delay immunization until after pregnancy. Unvaccinated health care workers born before 1957 who lack laboratory evidence of measles, mumps, or rubella immunity or laboratory confirmation of disease should consider measles and mumps immunization with 2 doses of MMR vaccine or rubella immunization with 1 dose of MMR vaccine.  Pneumococcal 13-valent conjugate (PCV13) vaccine. When indicated, a person who is uncertain of her immunization history and has no record of immunization should receive the PCV13 vaccine. An adult aged 19 years or older who has certain medical conditions and has not been previously immunized should receive 1 dose of PCV13 vaccine. This PCV13 should be followed with a dose of pneumococcal polysaccharide (PPSV23) vaccine. The PPSV23 vaccine dose should be obtained at least 8 weeks after the dose of PCV13 vaccine. An adult aged 19  years or older who has certain medical conditions and previously received 1 or more doses of PPSV23 vaccine should receive 1 dose of PCV13. The PCV13 vaccine dose should be obtained 1 or more years after the last PPSV23 vaccine dose.  Pneumococcal polysaccharide (PPSV23) vaccine. When PCV13 is also indicated, PCV13 should be obtained first. All adults aged 65 years and older should be immunized. An adult younger than age 65 years who has certain medical conditions should be immunized. Any person who resides in a nursing home or long-term care facility should be immunized. An adult smoker should be immunized. People with an immunocompromised condition and certain other conditions should receive both PCV13 and PPSV23 vaccines. People with human immunodeficiency virus (HIV) infection should be immunized as soon as possible after diagnosis. Immunization during chemotherapy or radiation therapy should be avoided. Routine use of PPSV23 vaccine is not recommended for American Indians, [place] Natives, or people younger than 65 years unless there are medical conditions that require PPSV23 vaccine. When indicated, people who have unknown immunization and have no record of immunization should receive PPSV23 vaccine. One-time revaccination 5 years after the first dose of PPSV23 is recommended for people aged 19-64 years who have chronic kidney failure, nephrotic syndrome, asplenia, or immunocompromised conditions. People who received 1-2 doses of PPSV23 before age 65 years should receive another dose of PPSV23 vaccine at age 65 years or later if at least 5 years have passed since the previous dose. Doses of PPSV23 are not needed for people immunized with PPSV23 at or after age 65 years.  Meningococcal vaccine. Adults with asplenia or persistent complement component deficiencies should receive 2 doses of quadrivalent meningococcal conjugate (MenACWY-D) vaccine. The doses should be obtained at least 2 months apart.  Microbiologists working with certain meningococcal bacteria, military recruits, people at risk during an outbreak, and people who travel to or live in countries with a high rate of meningitis should be immunized. A first-year college student up through age   21 years who is living in a residence hall should receive a dose if she did not receive a dose on or after her 16th birthday. Adults who have certain high-risk conditions should receive one or more doses of vaccine.  Hepatitis A vaccine. Adults who wish to be protected from this disease, have certain high-risk conditions, work with hepatitis A-infected animals, work in hepatitis A research labs, or travel to or work in countries with a high rate of hepatitis A should be immunized. Adults who were previously unvaccinated and who anticipate close contact with an international adoptee during the first 60 days after arrival in the [place] from a country with a high rate of hepatitis A should be immunized.  Hepatitis B vaccine. Adults who wish to be protected from this disease, have certain high-risk conditions, may be exposed to blood or other infectious body fluids, are household contacts or sex partners of hepatitis B positive people, are clients or workers in certain care facilities, or travel to or work in countries with a high rate of hepatitis B should be immunized.  Haemophilus influenzae type b (Hib) vaccine. A previously unvaccinated person with asplenia or sickle cell disease or having a scheduled splenectomy should receive 1 dose of Hib vaccine. Regardless of previous immunization, a recipient of a hematopoietic stem cell transplant should receive a 3-dose series 6-12 months after her successful transplant. Hib vaccine is not recommended for adults with HIV infection. Preventive Services / Frequency Ages 14 to 46 years  Blood pressure check.** / Every 1 to 2 years.  Lipid and cholesterol check.** / Every 5 years beginning at age  7.  Clinical breast exam.** / Every 3 years for women in their 22s and 75s.  BRCA-related cancer risk assessment.** / For women who have family members with a BRCA-related cancer (breast, ovarian, tubal, or peritoneal cancers).  Pap test.** / Every 2 years from ages 79 through 79. Every 3 years starting at age 54 through age 64 or 19 with a history of 3 consecutive normal Pap tests.  HPV screening.** / Every 3 years from ages 51 through ages 59 to 56 with a history of 3 consecutive normal Pap tests.  Hepatitis C blood test.** / For any individual with known risks for hepatitis C.  Skin self-exam. / Monthly.  Influenza vaccine. / Every year.  Tetanus, diphtheria, and acellular pertussis (Tdap, Td) vaccine.** / Consult your health care provider. Pregnant women should receive 1 dose of Tdap vaccine during each pregnancy. 1 dose of Td every 10 years.  Varicella vaccine.** / Consult your health care provider. Pregnant females who do not have evidence of immunity should receive the first dose after pregnancy.  HPV vaccine. / 3 doses over 6 months, if 41 and younger. The vaccine is not recommended for use in pregnant females. However, pregnancy testing is not needed before receiving a dose.  Measles, mumps, rubella (MMR) vaccine.** / You need at least 1 dose of MMR if you were born in 1957 or later. You may also need a 2nd dose. For females of childbearing age, rubella immunity should be determined. If there is no evidence of immunity, females who are not pregnant should be vaccinated. If there is no evidence of immunity, females who are pregnant should delay immunization until after pregnancy.  Pneumococcal 13-valent conjugate (PCV13) vaccine.** / Consult your health care provider.  Pneumococcal polysaccharide (PPSV23) vaccine.** / 1 to 2 doses if you smoke cigarettes or if you have certain conditions.  Meningococcal vaccine.** /  1 dose if you are age 19 to 21 years and a first-year college  student living in a residence hall, or have one of several medical conditions, you need to get vaccinated against meningococcal disease. You may also need additional booster doses.  Hepatitis A vaccine.** / Consult your health care provider.  Hepatitis B vaccine.** / Consult your health care provider.  Haemophilus influenzae type b (Hib) vaccine.** / Consult your health care provider. Ages 40 to 64 years  Blood pressure check.** / Every 1 to 2 years.  Lipid and cholesterol check.** / Every 5 years beginning at age 20 years.  Lung cancer screening. / Every year if you are aged 55-80 years and have a 30-pack-year history of smoking and currently smoke or have quit within the past 15 years. Yearly screening is stopped once you have quit smoking for at least 15 years or develop a health problem that would prevent you from having lung cancer treatment.  Clinical breast exam.** / Every year after age 40 years.  BRCA-related cancer risk assessment.** / For women who have family members with a BRCA-related cancer (breast, ovarian, tubal, or peritoneal cancers).  Mammogram.** / Every year beginning at age 40 years and continuing for as long as you are in good health. Consult with your health care provider.  Pap test.** / Every 3 years starting at age 30 years through age 65 or 70 years with a history of 3 consecutive normal Pap tests.  HPV screening.** / Every 3 years from ages 30 years through ages 65 to 70 years with a history of 3 consecutive normal Pap tests.  Fecal occult blood test (FOBT) of stool. / Every year beginning at age 50 years and continuing until age 75 years. You may not need to do this test if you get a colonoscopy every 10 years.  Flexible sigmoidoscopy or colonoscopy.** / Every 5 years for a flexible sigmoidoscopy or every 10 years for a colonoscopy beginning at age 50 years and continuing until age 75 years.  Hepatitis C blood test.** / For all people born from 1945 through  1965 and any individual with known risks for hepatitis C.  Skin self-exam. / Monthly.  Influenza vaccine. / Every year.  Tetanus, diphtheria, and acellular pertussis (Tdap/Td) vaccine.** / Consult your health care provider. Pregnant women should receive 1 dose of Tdap vaccine during each pregnancy. 1 dose of Td every 10 years.  Varicella vaccine.** / Consult your health care provider. Pregnant females who do not have evidence of immunity should receive the first dose after pregnancy.  Zoster vaccine.** / 1 dose for adults aged 60 years or older.  Measles, mumps, rubella (MMR) vaccine.** / You need at least 1 dose of MMR if you were born in 1957 or later. You may also need a 2nd dose. For females of childbearing age, rubella immunity should be determined. If there is no evidence of immunity, females who are not pregnant should be vaccinated. If there is no evidence of immunity, females who are pregnant should delay immunization until after pregnancy.  Pneumococcal 13-valent conjugate (PCV13) vaccine.** / Consult your health care provider.  Pneumococcal polysaccharide (PPSV23) vaccine.** / 1 to 2 doses if you smoke cigarettes or if you have certain conditions.  Meningococcal vaccine.** / Consult your health care provider.  Hepatitis A vaccine.** / Consult your health care provider.  Hepatitis B vaccine.** / Consult your health care provider.  Haemophilus influenzae type b (Hib) vaccine.** / Consult your health care provider. Ages 65   years and over  Blood pressure check.** / Every 1 to 2 years.  Lipid and cholesterol check.** / Every 5 years beginning at age 51 years.  Lung cancer screening. / Every year if you are aged 27-80 years and have a 30-pack-year history of smoking and currently smoke or have quit within the past 15 years. Yearly screening is stopped once you have quit smoking for at least 15 years or develop a health problem that would prevent you from having lung cancer  treatment.  Clinical breast exam.** / Every year after age 8 years.  BRCA-related cancer risk assessment.** / For women who have family members with a BRCA-related cancer (breast, ovarian, tubal, or peritoneal cancers).  Mammogram.** / Every year beginning at age 34 years and continuing for as long as you are in good health. Consult with your health care provider.  Pap test.** / Every 3 years starting at age 49 years through age 47 or 20 years with 3 consecutive normal Pap tests. Testing can be stopped between 65 and 70 years with 3 consecutive normal Pap tests and no abnormal Pap or HPV tests in the past 10 years.  HPV screening.** / Every 3 years from ages 14 years through ages 69 or 70 years with a history of 3 consecutive normal Pap tests. Testing can be stopped between 65 and 70 years with 3 consecutive normal Pap tests and no abnormal Pap or HPV tests in the past 10 years.  Fecal occult blood test (FOBT) of stool. / Every year beginning at age 85 years and continuing until age 12 years. You may not need to do this test if you get a colonoscopy every 10 years.  Flexible sigmoidoscopy or colonoscopy.** / Every 5 years for a flexible sigmoidoscopy or every 10 years for a colonoscopy beginning at age 78 years and continuing until age 64 years.  Hepatitis C blood test.** / For all people born from [birth year] through 1964 and any individual with known risks for hepatitis C.  Osteoporosis screening.** / A one-time screening for women ages 13 years and over and women at risk for fractures or osteoporosis.  Skin self-exam. / Monthly.  Influenza vaccine. / Every year.  Tetanus, diphtheria, and acellular pertussis (Tdap/Td) vaccine.** / 1 dose of Td every 10 years.  Varicella vaccine.** / Consult your health care provider.  Zoster vaccine.** / 1 dose for adults aged 25 years or older.  Pneumococcal 13-valent conjugate (PCV13) vaccine.** / Consult your health care provider.  Pneumococcal  polysaccharide (PPSV23) vaccine.** / 1 dose for all adults aged 31 years and older.  Meningococcal vaccine.** / Consult your health care provider.  Hepatitis A vaccine.** / Consult your health care provider.  Hepatitis B vaccine.** / Consult your health care provider.  Haemophilus influenzae type b (Hib) vaccine.** / Consult your health care provider. ** Family history and personal history of risk and conditions may change your health care provider's recommendations. Document Released: 06/29/2001 Document Revised: 09/17/2013 Document Reviewed: 09/28/2010 [facility] Patient Information 2015 [place], [place]. This information is not intended to replace advice given to you by your health care provider. Make sure you discuss any questions you have with your health care provider.

## 2013-12-20 LAB — CYTOLOGY - PAP

## 2014-01-09 DIAGNOSIS — R911 Solitary pulmonary nodule: Secondary | ICD-10-CM | POA: Insufficient documentation

## 2014-03-18 ENCOUNTER — Encounter: Payer: Self-pay | Admitting: Obstetrics and Gynecology

## 2014-04-25 ENCOUNTER — Ambulatory Visit: Payer: Self-pay | Admitting: Family

## 2014-05-06 ENCOUNTER — Ambulatory Visit: Payer: Self-pay | Admitting: Specialist

## 2014-08-30 ENCOUNTER — Other Ambulatory Visit: Payer: Self-pay | Admitting: Specialist

## 2014-08-30 DIAGNOSIS — R918 Other nonspecific abnormal finding of lung field: Secondary | ICD-10-CM

## 2014-12-26 ENCOUNTER — Encounter: Payer: Self-pay | Admitting: Obstetrics & Gynecology

## 2014-12-26 ENCOUNTER — Ambulatory Visit (INDEPENDENT_AMBULATORY_CARE_PROVIDER_SITE_OTHER): Payer: No Typology Code available for payment source | Admitting: Obstetrics & Gynecology

## 2014-12-26 VITALS — BP 143/87 | HR 73 | Resp 20 | Ht 65.0 in | Wt 276.0 lb

## 2014-12-26 DIAGNOSIS — Z1151 Encounter for screening for human papillomavirus (HPV): Secondary | ICD-10-CM | POA: Diagnosis not present

## 2014-12-26 DIAGNOSIS — Z01419 Encounter for gynecological examination (general) (routine) without abnormal findings: Secondary | ICD-10-CM

## 2014-12-26 DIAGNOSIS — Z124 Encounter for screening for malignant neoplasm of cervix: Secondary | ICD-10-CM | POA: Diagnosis not present

## 2014-12-26 DIAGNOSIS — Z0000 Encounter for general adult medical examination without abnormal findings: Secondary | ICD-10-CM

## 2014-12-26 NOTE — Progress Notes (Signed)
Subjective:    Krista Mclean is a 54 y.o. S P2 (28 yo daughter and 44 yo son) female who presents for an annual exam. The patient has no complaints today. The patient is not currently sexually active. GYN screening history: last pap: was normal. The patient wears seatbelts: yes. The patient participates in regular exercise: yes. Has the patient ever been transfused or tattooed?: no. The patient reports that there is not domestic violence in her life.   Menstrual History: OB History    Gravida Para Term Preterm AB TAB SAB Ectopic Multiple Living   2 2 2  0 0 0 0 0 0 2      Menarche age: 33  No LMP recorded. Patient is not currently having periods (Reason: Premenopausal).    The following portions of the patient's history were reviewed and updated as appropriate: allergies, current medications, past family history, past medical history, past social history, past surgical history and problem list.  Review of Systems A comprehensive review of systems was negative.  Abstinent for about 2 years. LMP 12/15. Works at [employer]. Gets mammograms there annually. She is going to the bariatric center. She has not had a colonoscopy but is aware that it is recommended.   Objective:    BP 143/87 mmHg  Pulse 73  Resp 20  Ht 5\' 5"  (1.651 m)  Wt 276 lb (125.193 kg)  BMI 45.93 kg/m2  General Appearance:    Alert, cooperative, no distress, appears stated age  Head:    Normocephalic, without obvious abnormality, atraumatic  Eyes:    PERRL, conjunctiva/corneas clear, EOM's intact, fundi    benign, both eyes  Ears:    Normal TM's and external ear canals, both ears  Nose:   Nares normal, septum midline, mucosa normal, no drainage    or sinus tenderness  Throat:   Lips, mucosa, and tongue normal; teeth and gums normal  Neck:   Supple, symmetrical, trachea midline, no adenopathy;    thyroid:  no enlargement/tenderness/nodules; no carotid   bruit or JVD  Back:     Symmetric, no curvature, ROM  normal, no CVA tenderness  Lungs:     Clear to auscultation bilaterally, respirations unlabored  Chest Wall:    No tenderness or deformity   Heart:    Regular rate and rhythm, S1 and S2 normal, no murmur, rub   or gallop  Breast Exam:    No tenderness, masses, or nipple abnormality  Abdomen:     Soft, non-tender, bowel sounds active all four quadrants,    no masses, no organomegaly  Genitalia:    Normal female without lesion, discharge or tenderness, no palpable masses, NSSA, NT, mobile     Extremities:   Extremities normal, atraumatic, no cyanosis or edema  Pulses:   2+ and symmetric all extremities  Skin:   Skin color, texture, turgor normal, no rashes or lesions  Lymph nodes:   Cervical, supraclavicular, and axillary nodes normal  Neurologic:   CNII-XII intact, normal strength, sensation and reflexes    throughout  .    Assessment:    Healthy female exam.    Plan:     Breast self exam technique reviewed and patient encouraged to perform self-exam monthly. Thin prep Pap smear. with cotesting Schedule colonoscopy

## 2014-12-31 LAB — CYTOLOGY - PAP

## 2015-04-07 ENCOUNTER — Ambulatory Visit
Admission: RE | Admit: 2015-04-07 | Discharge: 2015-04-07 | Disposition: A | Discharge status: Home / Self Care | Payer: No Typology Code available for payment source | Source: Ambulatory Visit | Attending: Gastroenterology | Admitting: Gastroenterology

## 2015-04-07 ENCOUNTER — Encounter
Admission: RE | Disposition: A | Discharge status: Home / Self Care | Payer: Self-pay | Source: Ambulatory Visit | Attending: Gastroenterology

## 2015-04-07 ENCOUNTER — Ambulatory Visit: Payer: No Typology Code available for payment source | Admitting: Anesthesiology

## 2015-04-07 ENCOUNTER — Encounter: Payer: Self-pay | Admitting: *Deleted

## 2015-04-07 DIAGNOSIS — E079 Disorder of thyroid, unspecified: Secondary | ICD-10-CM | POA: Diagnosis not present

## 2015-04-07 DIAGNOSIS — Z79899 Other long term (current) drug therapy: Secondary | ICD-10-CM | POA: Insufficient documentation

## 2015-04-07 DIAGNOSIS — K2950 Unspecified chronic gastritis without bleeding: Secondary | ICD-10-CM | POA: Diagnosis not present

## 2015-04-07 DIAGNOSIS — Z1211 Encounter for screening for malignant neoplasm of colon: Secondary | ICD-10-CM | POA: Diagnosis present

## 2015-04-07 DIAGNOSIS — Z01818 Encounter for other preprocedural examination: Secondary | ICD-10-CM | POA: Diagnosis present

## 2015-04-07 DIAGNOSIS — D127 Benign neoplasm of rectosigmoid junction: Secondary | ICD-10-CM | POA: Insufficient documentation

## 2015-04-07 HISTORY — PX: ESOPHAGOGASTRODUODENOSCOPY (EGD) WITH PROPOFOL: SHX5813

## 2015-04-07 HISTORY — PX: COLONOSCOPY WITH PROPOFOL: SHX5780

## 2015-04-07 SURGERY — COLONOSCOPY WITH PROPOFOL
Anesthesia: General

## 2015-04-07 MED ORDER — FENTANYL CITRATE (PF) 100 MCG/2ML IJ SOLN
INTRAMUSCULAR | Status: DC | PRN
  Administered 2015-04-07: 50 ug via INTRAVENOUS

## 2015-04-07 MED ORDER — SODIUM CHLORIDE 0.9 % IV SOLN
INTRAVENOUS | Status: DC
  Administered 2015-04-07: 11:00:00 via INTRAVENOUS

## 2015-04-07 MED ORDER — PROPOFOL 500 MG/50ML IV EMUL
INTRAVENOUS | Status: DC | PRN
  Administered 2015-04-07: 130 ug/kg/min via INTRAVENOUS

## 2015-04-07 MED ORDER — MIDAZOLAM HCL 2 MG/2ML IJ SOLN
INTRAMUSCULAR | Status: DC | PRN
  Administered 2015-04-07: 1 mg via INTRAVENOUS

## 2015-04-07 NOTE — H&P (Signed)
    Primary Care Physician:  Marden Noble, MD Primary Gastroenterologist:  Dr. Candace Cruise  Pre-Procedure History & Physical: HPI:  Krista Mclean is a 54 y.o. female is here for an EGD/colonoscopy.  Past Medical History  Diagnosis Date  . Thyroid disease   . Abnormal Pap smear 10/07/1998    mild dysplasia/leep done  . Morbid obesity     Past Surgical History  Procedure Laterality Date  . Cesarean section  1993  . Cesarean section  03/02/2002    ELECTIVE  . Laparoscopic tubal ligation  03/02/2002  . Tonsillectomy      Prior to Admission medications   Medication Sig Start Date End Date Taking? Authorizing Provider  cyclobenzaprine (FLEXERIL) 5 MG tablet Take 5 mg by mouth 3 (three) times daily as needed for muscle spasms.    Historical Provider, MD  ibuprofen (ADVIL,MOTRIN) 800 MG tablet Take 800 mg by mouth every 8 (eight) hours as needed.    Historical Provider, MD  levothyroxine (SYNTHROID, LEVOTHROID) 137 MCG tablet Take by mouth.    Historical Provider, MD  phentermine (ADIPEX-P) 37.5 MG tablet Take by mouth. 10/24/13   Historical Provider, MD    Allergies as of 02/13/2015  . (No Known Allergies)    Family History  Problem Relation Age of Onset  . Diabetes Mother   . Arthritis Mother   . Arthritis Father   . Heart disease Son 57    CARDIO MYOPATHY  . Hypertension Maternal Grandmother     Social History   Social History  . Marital Status: Widowed    Spouse Name: N/A  . Number of Children: N/A  . Years of Education: N/A   Occupational History  . Not on file.   Social History Main Topics  . Smoking status: Never Smoker   . Smokeless tobacco: Never Used  . Alcohol Use: No  . Drug Use: No  . Sexual Activity:    Partners: Male    Birth Control/ Protection: Surgical   Other Topics Concern  . Not on file   Social History Narrative    Review of Systems: See HPI, otherwise negative ROS  Physical Exam: There were no vitals taken for this visit. General:    Alert,  pleasant and cooperative in NAD Head:  Normocephalic and atraumatic. Neck:  Supple; no masses or thyromegaly. Lungs:  Clear throughout to auscultation.    Heart:  Regular rate and rhythm. Abdomen:  Soft, nontender and nondistended. Normal bowel sounds, without guarding, and without rebound.   Neurologic:  Alert and  oriented x4;  grossly normal neurologically.  Impression/Plan: Krista Mclean is here for an EGD/colonoscopy to be performed for screening, preop for bariatric surgery.  Risks, benefits, limitations, and alternatives regarding EGD/colonoscopy have been reviewed with the patient.  Questions have been answered.  All parties agreeable.   Baily Serpe, Lupita Dawn, MD  04/07/2015, 9:17 AM

## 2015-04-07 NOTE — Anesthesia Preprocedure Evaluation (Signed)
Anesthesia Evaluation  Patient identified by MRN, date of birth, ID band Patient awake    Reviewed: Allergy & Precautions, NPO status , Patient's Chart, lab work & pertinent test results  Airway Mallampati: II  TM Distance: >3 FB     Dental   Pulmonary neg pulmonary ROS,    Pulmonary exam normal breath sounds clear to auscultation       Cardiovascular negative cardio ROS Normal cardiovascular exam     Neuro/Psych negative neurological ROS  negative psych ROS   GI/Hepatic negative GI ROS, Neg liver ROS,   Endo/Other  Hypothyroidism   Renal/GU negative Renal ROS  negative genitourinary   Musculoskeletal negative musculoskeletal ROS (+)   Abdominal Normal abdominal exam  (+)   Peds negative pediatric ROS (+)  Hematology negative hematology ROS (+)   Anesthesia Other Findings Morbid obesity  Reproductive/Obstetrics                             Anesthesia Physical Anesthesia Plan  ASA: II  Anesthesia Plan: General   Post-op Pain Management:    Induction: Intravenous  Airway Management Planned: Nasal Cannula  Additional Equipment:   Intra-op Plan:   Post-operative Plan:   Informed Consent: I have reviewed the patients History and Physical, chart, labs and discussed the procedure including the risks, benefits and alternatives for the proposed anesthesia with the patient or authorized representative who has indicated his/her understanding and acceptance.   Dental advisory given  Plan Discussed with: CRNA and Surgeon  Anesthesia Plan Comments:         Anesthesia Quick Evaluation

## 2015-04-07 NOTE — Op Note (Signed)
[facility] Gastroenterology Patient Name: Krista Mclean Procedure Date: 04/07/2015 10:38 AM MRN: [id number] Account #: [id number] Date of Birth: 03-17-1961 Admit Type: Outpatient Age: 54 Room: [facility] ENDO ROOM 4 Gender: Female Note Status: Finalized Procedure:         Colonoscopy Indications:       Screening for colorectal malignant neoplasm Providers:         Lupita Dawn. Candace Cruise, MD Referring MD:      Mikeal Hawthorne. Brynda Greathouse, MD (Referring MD) Medicines:         Monitored Anesthesia Care Complications:     No immediate complications. Procedure:         Pre-Anesthesia Assessment:                    - Prior to the procedure, a History and Physical was                     performed, and patient medications, allergies and                     sensitivities were reviewed. The patient's tolerance of                     previous anesthesia was reviewed.                    - The risks and benefits of the procedure and the sedation                     options and risks were discussed with the patient. All                     questions were answered and informed consent was obtained.                    - After reviewing the risks and benefits, the patient was                     deemed in satisfactory condition to undergo the procedure.                    After obtaining informed consent, the colonoscope was                     passed under direct vision. Throughout the procedure, the                     patient's blood pressure, pulse, and oxygen saturations                     were monitored continuously. The Olympus CF-Q160AL                     colonoscope (S#. [id number]) was introduced through the anus                     and advanced to the the ascending colon. The colonoscopy                     was performed with moderate difficulty due to significant                     looping. The patient tolerated the procedure well. The  quality of the bowel preparation was  poor. Findings:      Two sessile polyps were found in the recto-sigmoid colon. The polyps       were small in size. These polyps were removed with a cold snare.       Resection and retrieval were complete. Unable to reach cecum due to       looping of scope.      The exam was otherwise without abnormality. Impression:        - Preparation of the colon was poor.                    - Two small polyps at the recto-sigmoid colon. Resected                     and retrieved.                    - The examination was otherwise normal. Recommendation:    - Discharge patient to home.                    - Await pathology results.                    - Repeat colonoscopy in 3 years for surveillance based on                     pathology results.                    - The findings and recommendations were discussed with the                     patient.                    - Once pt loses weight after bariatric surgery, may be                     able to reach cecum Procedure Code(s): --- Professional ---                    [phone], 52, Colonoscopy, flexible; with removal of                     tumor(s), polyp(s), or other lesion(s) by snare technique Diagnosis Code(s): --- Professional ---                    Z12.11, Encounter for screening for malignant neoplasm of                     colon                    D12.7, Benign neoplasm of rectosigmoid junction CPT copyright 2014 American Medical Association. All rights reserved. The codes documented in this report are preliminary and upon coder review may  be revised to meet current compliance requirements. Hulen Luster, MD 04/07/2015 11:09:52 AM This report has been signed electronically. Number of Addenda: 0 Note Initiated On: 04/07/2015 10:38 AM Scope Withdrawal Time: 0 hours 6 minutes 38 seconds  Total Procedure Duration: 0 hours 18 minutes 58 seconds       [facility]

## 2015-04-07 NOTE — Op Note (Signed)
[facility] Gastroenterology Patient Name: Krista Mclean Procedure Date: 04/07/2015 10:39 AM MRN: [id number] Account #: [id number] Date of Birth: 1961-04-03 Admit Type: Outpatient Age: 54 Room: [facility] ENDO ROOM 4 Gender: Female Note Status: Finalized Procedure:         Upper GI endoscopy Indications:       preop for bariatric surgery Providers:         Lupita Dawn. Candace Cruise, MD Referring MD:      Mikeal Hawthorne. Brynda Greathouse, MD (Referring MD) Medicines:         Monitored Anesthesia Care Complications:     No immediate complications. Procedure:         Pre-Anesthesia Assessment:                    - Prior to the procedure, a History and Physical was                     performed, and patient medications, allergies and                     sensitivities were reviewed. The patient's tolerance of                     previous anesthesia was reviewed.                    - The risks and benefits of the procedure and the sedation                     options and risks were discussed with the patient. All                     questions were answered and informed consent was obtained.                    - After reviewing the risks and benefits, the patient was                     deemed in satisfactory condition to undergo the procedure.                    After obtaining informed consent, the endoscope was passed                     under direct vision. Throughout the procedure, the                     patient's blood pressure, pulse, and oxygen saturations                     were monitored continuously. The Endoscope was introduced                     through the mouth, and advanced to the second part of                     duodenum. The upper GI endoscopy was accomplished without                     difficulty. The patient tolerated the procedure well. Findings:      The examined esophagus was normal.      The entire examined stomach was normal. Biopsies were taken with a cold       forceps for  Helicobacter pylori  testing.      The examined duodenum was normal. Impression:        - Normal esophagus.                    - Normal stomach. Biopsied.                    - Normal examined duodenum. Recommendation:    - Discharge patient to home.                    - Observe patient's clinical course.                    - Await pathology results.                    - The findings and recommendations were discussed with the                     patient.                    - Pt is cleared for bariatric surgery. Procedure Code(s): --- Professional ---                    [phone], Esophagogastroduodenoscopy, flexible, transoral;                     with biopsy, single or multiple CPT copyright 2014 American Medical Association. All rights reserved. The codes documented in this report are preliminary and upon coder review may  be revised to meet current compliance requirements. Hulen Luster, MD 04/07/2015 10:45:54 AM This report has been signed electronically. Number of Addenda: 0 Note Initiated On: 04/07/2015 10:39 AM      [facility]

## 2015-04-07 NOTE — Transfer of Care (Signed)
Immediate Anesthesia Transfer of Care Note  Patient: Krista Mclean  Procedure(s) Performed: Procedure(s): COLONOSCOPY WITH PROPOFOL (N/A) ESOPHAGOGASTRODUODENOSCOPY (EGD) WITH PROPOFOL (N/A)  Patient Location: PACU and Endoscopy Unit  Anesthesia Type:General  Level of Consciousness: sedated and responds to stimulation  Airway & Oxygen Therapy: Patient Spontanous Breathing and Patient connected to nasal cannula oxygen  Post-op Assessment: Report given to RN and Post -op Vital signs reviewed and stable  Post vital signs: Reviewed and stable  Last Vitals:  Filed Vitals:   04/07/15 0930 04/07/15 1112  BP: 155/83 131/94  Pulse: 79 89  Temp: 37.2 C 36.4 C  Resp: 14 16    Complications: No apparent anesthesia complications

## 2015-04-08 ENCOUNTER — Encounter: Payer: Self-pay | Admitting: Gastroenterology

## 2015-04-08 LAB — SURGICAL PATHOLOGY

## 2015-04-11 NOTE — Anesthesia Postprocedure Evaluation (Signed)
Anesthesia Post Note  Patient: Krista Mclean  Procedure(s) Performed: Procedure(s) (LRB): COLONOSCOPY WITH PROPOFOL (N/A) ESOPHAGOGASTRODUODENOSCOPY (EGD) WITH PROPOFOL (N/A)  Patient location during evaluation: Endoscopy Anesthesia Type: General Level of consciousness: awake, awake and alert and oriented Pain management: pain level controlled Vital Signs Assessment: post-procedure vital signs reviewed and stable Respiratory status: spontaneous breathing Cardiovascular status: blood pressure returned to baseline Anesthetic complications: no    Last Vitals:  Filed Vitals:   04/07/15 1133 04/07/15 1143  BP: 138/75 130/91  Pulse: 74 69  Temp:    Resp: 19 22    Last Pain:  Filed Vitals:   04/08/15 0742  PainSc: 0-No pain                 Clevester Helzer

## 2015-05-06 ENCOUNTER — Ambulatory Visit
Admission: RE | Admit: 2015-05-06 | Discharge: 2015-05-06 | Disposition: A | Discharge status: Home / Self Care | Payer: PRIVATE HEALTH INSURANCE | Source: Ambulatory Visit | Attending: Specialist | Admitting: Specialist

## 2015-05-06 DIAGNOSIS — R918 Other nonspecific abnormal finding of lung field: Secondary | ICD-10-CM | POA: Diagnosis present

## 2015-05-06 DIAGNOSIS — K449 Diaphragmatic hernia without obstruction or gangrene: Secondary | ICD-10-CM | POA: Insufficient documentation

## 2015-09-08 ENCOUNTER — Other Ambulatory Visit: Payer: Self-pay | Admitting: Adult Health

## 2015-09-08 DIAGNOSIS — Z1231 Encounter for screening mammogram for malignant neoplasm of breast: Secondary | ICD-10-CM

## 2015-09-08 DIAGNOSIS — Z6841 Body Mass Index (BMI) 40.0 and over, adult: Secondary | ICD-10-CM

## 2015-09-18 ENCOUNTER — Ambulatory Visit: Payer: No Typology Code available for payment source

## 2015-09-25 ENCOUNTER — Ambulatory Visit: Payer: No Typology Code available for payment source

## 2015-09-25 ENCOUNTER — Ambulatory Visit
Admission: RE | Admit: 2015-09-25 | Discharge: 2015-09-25 | Disposition: A | Discharge status: Home / Self Care | Payer: PRIVATE HEALTH INSURANCE | Source: Ambulatory Visit | Attending: Adult Health | Admitting: Adult Health

## 2015-09-25 DIAGNOSIS — Z1231 Encounter for screening mammogram for malignant neoplasm of breast: Secondary | ICD-10-CM | POA: Insufficient documentation

## 2015-09-29 ENCOUNTER — Encounter: Payer: No Typology Code available for payment source | Attending: Family Medicine | Admitting: *Deleted

## 2015-09-29 ENCOUNTER — Encounter: Payer: Self-pay | Admitting: *Deleted

## 2015-09-29 VITALS — BP 134/90 | Ht 66.0 in | Wt 274.1 lb

## 2015-09-29 DIAGNOSIS — E119 Type 2 diabetes mellitus without complications: Secondary | ICD-10-CM | POA: Insufficient documentation

## 2015-09-29 NOTE — Patient Instructions (Addendum)
Check blood sugars 2 x day before breakfast and 2 hrs after supper 3 x week Exercise: Continue walking  for  30  minutes   3 days a week and gradually increase to 150 minutes/week as tolerated Eat 3 meals day, 1-2 snacks a day Space meals 4-6 hours apart Don't skip meals Limit fried foods Avoid sugar sweetened drinks (soda, coffee) Make an eye doctor appointment Bring blood sugar records to the next class Call your doctor for a prescription for:  1. Meter strips (type) One Touch Verio  checking  3 times per week  2. Lancets (type) One Touch Delica checking  3      times per week

## 2015-09-30 NOTE — Progress Notes (Signed)
Diabetes Self-Management Education  Visit Type: First/Initial  Appt. Start Time: 1550 Appt. End Time: 1700  09/30/2015  Ms. Krista Mclean, identified by name and date of birth, is a 55 y.o. female with a diagnosis of Diabetes: Type 2.   ASSESSMENT  Blood pressure 134/90, height 5\' 6"  (1.676 m), weight 274 lb 1.6 oz (124.331 kg), last menstrual period 01/13/2014. Body mass index is 44.26 kg/(m^2).      Diabetes Self-Management Education - 09/29/15 1740    Visit Information   Visit Type First/Initial   Initial Visit   Diabetes Type Type 2   Are you currently following a meal plan? Yes   What type of meal plan do you follow? "cut back on fried foods and sodas"   Are you taking your medications as prescribed? Yes   Date Diagnosed April 2017   Health Coping   How would you rate your overall health? Good   Psychosocial Assessment   Patient Belief/Attitude about Diabetes Afraid  "bad"   Self-care barriers None   Self-management support Doctor's office   Patient Concerns Nutrition/Meal planning;Weight Control;Problem Solving;Healthy Lifestyle   Special Needs None   Preferred Learning Style Auditory;Visual   Learning Readiness Contemplating   How often do you need to have someone help you when you read instructions, pamphlets, or other written materials from your doctor or pharmacy? 1 - Never   What is the last grade level you completed in school? 12th   Pre-Education Assessment   Patient understands the diabetes disease and treatment process. Needs Instruction   Patient understands incorporating nutritional management into lifestyle. Needs Instruction   Patient undertands incorporating physical activity into lifestyle. Needs Instruction   Patient understands using medications safely. Needs Instruction   Patient understands monitoring blood glucose, interpreting and using results Needs Instruction   Patient understands prevention, detection, and treatment of acute complications.  Needs Instruction   Patient understands prevention, detection, and treatment of chronic complications. Needs Instruction   Patient understands how to develop strategies to address psychosocial issues. Needs Instruction   Patient understands how to develop strategies to promote health/change behavior. Needs Instruction   Complications   Last HgB A1C per patient/outside source --  Pt unsure of her A1C   How often do you check your blood sugar? 0 times/day (not testing)  Provided One Touch Verio Flex meter and instructed on use. BG upon return demonstration was 88 mg/dL at 4;45 pm - 4 1/2 hrs pp.    Have you had a dilated eye exam in the past 12 months? No   Have you had a dental exam in the past 12 months? Yes   Are you checking your feet? Yes   How many days per week are you checking your feet? 7   Dietary Intake   Breakfast hash brown and chicken biscuit   Lunch skips or finishes breakfast meal   Snack (afternoon) bacon and cheddar fries in the snack machine   Dinner meat (usually fried), peans, green beans, turnip greens   Beverage(s) water, regular soda, sugar sweetened coffee   Exercise   Exercise Type Light (walking / raking leaves)   How many days per week to you exercise? 3   How many minutes per day do you exercise? 30   Total minutes per week of exercise 90   Patient Education   Previous Diabetes Education No   Disease state  Definition of diabetes, type 1 and 2, and the diagnosis of diabetes;Factors that contribute to the development  of diabetes   Nutrition management  Role of diet in the treatment of diabetes and the relationship between the three main macronutrients and blood glucose level   Physical activity and exercise  Role of exercise on diabetes management, blood pressure control and cardiac health.   Monitoring Taught/evaluated SMBG meter.;Purpose and frequency of SMBG.;Identified appropriate SMBG and/or A1C goals.   Chronic complications Relationship between chronic  complications and blood glucose control   Psychosocial adjustment Identified and addressed patients feelings and concerns about diabetes   Individualized Goals (developed by patient)   Reducing Risk Prevent diabetes complications Lose weight Lead a healthier lifestyle Become more fit   Outcomes   Expected Outcomes Demonstrated interest in learning. Expect positive outcomes   Future DMSE 3 wks      Individualized Plan for Diabetes Self-Management Training:   Learning Objective:  Patient will have a greater understanding of diabetes self-management. Patient education plan is to attend individual and/or group sessions per assessed needs and concerns.   Plan:   Patient Instructions  Check blood sugars 2 x day before breakfast and 2 hrs after supper 3 x week Exercise: Continue walking  for  30  minutes   3 days a week and gradually increase to 150 minutes/week as tolerated Eat 3 meals day, 1-2 snacks a day Space meals 4-6 hours apart Don't skip meals Limit fried foods Avoid sugar sweetened drinks (soda, coffee) Make an eye doctor appointment Bring blood sugar records to the next class Call your doctor for a prescription for:  1. Meter strips (type) One Touch Verio  checking  3 times per week  2. Lancets (type) One Touch Delica checking  3      times per week   Expected Outcomes:  Demonstrated interest in learning. Expect positive outcomes  Education material provided:  General Meal Planning Guidelines Simple Meal Plan Meter - One Touch Verio Flex  If problems or questions, patient to contact team via:    Johny Drilling, [place], [place], CDE [phone]  Future DSME appointment: 3 wks October 20, 2015 for Diabetes Class 1

## 2015-10-06 DIAGNOSIS — E119 Type 2 diabetes mellitus without complications: Secondary | ICD-10-CM | POA: Insufficient documentation

## 2015-10-16 ENCOUNTER — Telehealth: Payer: Self-pay | Admitting: *Deleted

## 2015-10-16 NOTE — Telephone Encounter (Signed)
Pt called and requested another glucometer. Her insurance company didn't prefer the one that was given at first visit. Accu-Chek Connect meter left at front office for her to pick up since this is preferred. She will call back if she has difficulty using meter.

## 2015-10-20 ENCOUNTER — Encounter: Payer: No Typology Code available for payment source | Attending: Family Medicine | Admitting: Dietician

## 2015-10-20 ENCOUNTER — Encounter: Payer: Self-pay | Admitting: Dietician

## 2015-10-20 VITALS — Ht 66.0 in | Wt 274.6 lb

## 2015-10-20 DIAGNOSIS — E119 Type 2 diabetes mellitus without complications: Secondary | ICD-10-CM | POA: Insufficient documentation

## 2015-10-20 NOTE — Progress Notes (Signed)

## 2015-10-27 ENCOUNTER — Encounter: Payer: No Typology Code available for payment source | Admitting: *Deleted

## 2015-10-27 ENCOUNTER — Encounter: Payer: Self-pay | Admitting: *Deleted

## 2015-10-27 VITALS — Wt 272.1 lb

## 2015-10-27 DIAGNOSIS — E119 Type 2 diabetes mellitus without complications: Secondary | ICD-10-CM | POA: Diagnosis not present

## 2015-10-27 NOTE — Progress Notes (Signed)

## 2015-11-03 ENCOUNTER — Encounter: Payer: No Typology Code available for payment source | Admitting: Dietician

## 2015-11-03 ENCOUNTER — Encounter: Payer: Self-pay | Admitting: Dietician

## 2015-11-03 VITALS — BP 136/86 | Ht 66.0 in | Wt 270.8 lb

## 2015-11-03 DIAGNOSIS — E119 Type 2 diabetes mellitus without complications: Secondary | ICD-10-CM

## 2015-11-03 NOTE — Progress Notes (Signed)

## 2015-11-11 ENCOUNTER — Encounter: Payer: Self-pay | Admitting: *Deleted

## 2016-01-01 ENCOUNTER — Ambulatory Visit (INDEPENDENT_AMBULATORY_CARE_PROVIDER_SITE_OTHER): Payer: PRIVATE HEALTH INSURANCE | Admitting: Obstetrics & Gynecology

## 2016-01-01 ENCOUNTER — Encounter: Payer: Self-pay | Admitting: Obstetrics & Gynecology

## 2016-01-01 VITALS — BP 149/81 | HR 81 | Ht 66.0 in | Wt 275.0 lb

## 2016-01-01 DIAGNOSIS — Z01419 Encounter for gynecological examination (general) (routine) without abnormal findings: Secondary | ICD-10-CM | POA: Diagnosis not present

## 2016-01-01 DIAGNOSIS — N9089 Other specified noninflammatory disorders of vulva and perineum: Secondary | ICD-10-CM | POA: Diagnosis not present

## 2016-01-01 NOTE — Progress Notes (Signed)
Subjective:    Krista Mclean is a 56 y.o. S AA P2 (57 yo daughter and 24 yo son) female who presents for an annual exam. The patient has no complaints today. The patient is not currently sexually active. GYN screening history: last pap: was normal. The patient wears seatbelts: yes. The patient participates in regular exercise: yes. Has the patient ever been transfused or tattooed?: no. The patient reports that there is not domestic violence in her life.   Menstrual History: OB History    Gravida Para Term Preterm AB Living   2 2 2  0 0 2   SAB TAB Ectopic Multiple Live Births   0 0 0 0 2      Menarche age: 18 Patient's last menstrual period was 01/13/2014.    The following portions of the patient's history were reviewed and updated as appropriate: allergies, current medications, past family history, past medical history, past social history, past surgical history and problem list.  Review of Systems Pertinent items are noted in HPI. Her mammogram was normal this year. Her colonoscopy was done within the last year. Her insurance company denied paying for bariatric surgery so she did not do it last year. She will be seeing her FP next month and will get blood work then.   Objective:    BP (!) 149/81   Pulse 81   Ht 5\' 6"  (1.676 m)   Wt 275 lb (124.7 kg)   LMP 01/13/2014 Comment: Has been 1 year since last menstrual cycle  BMI 44.39 kg/m   General Appearance:    Alert, cooperative, no distress, appears stated age  Head:    Normocephalic, without obvious abnormality, atraumatic  Eyes:    PERRL, conjunctiva/corneas clear, EOM's intact, fundi    benign, both eyes  Ears:    Normal TM's and external ear canals, both ears  Nose:   Nares normal, septum midline, mucosa normal, no drainage    or sinus tenderness  Throat:   Lips, mucosa, and tongue normal; teeth and gums normal  Neck:   Supple, symmetrical, trachea midline, no adenopathy;    thyroid:  no enlargement/tenderness/nodules; no  carotid   bruit or JVD  Back:     Symmetric, no curvature, ROM normal, no CVA tenderness  Lungs:     Clear to auscultation bilaterally, respirations unlabored  Chest Wall:    No tenderness or deformity   Heart:    Regular rate and rhythm, S1 and S2 normal, no murmur, rub   or gallop  Breast Exam:    No tenderness, masses, or nipple abnormality  Abdomen:     Soft, non-tender, bowel sounds active all four quadrants,    no masses, no organomegaly  Genitalia:    Normal female without lesion, discharge or tenderness, 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    Assessment:    Healthy female exam.   Vulvar excoriations   Plan:     Check HSV 2 IgG

## 2016-01-02 LAB — HSV 2 ANTIBODY, IGG

## 2016-02-02 ENCOUNTER — Telehealth: Payer: Self-pay | Admitting: *Deleted

## 2016-02-03 NOTE — Telephone Encounter (Signed)
See telephone encounter.

## 2016-11-07 IMAGING — CT CT CHEST W/O CM
2 of 3 series · 15 of 36 positions shown, 18 images · non-contrast
Comparison: 05/06/2014 chest CT.

CLINICAL DATA: Follow-up pulmonary nodule.

EXAM:
CT CHEST WITHOUT CONTRAST
TECHNIQUE: Multidetector CT imaging of the chest was performed following the
standard protocol without IV contrast.

[Series 2: routine chest wo · axial · 0.72mm/px · z∈[-561,-321]mm · 12 of 58 slices shown, 15 images]
[im 5/58  mediastinal]
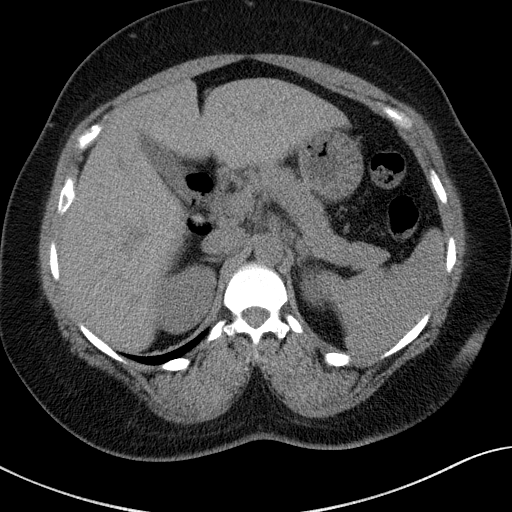
[im 5/58  lung]
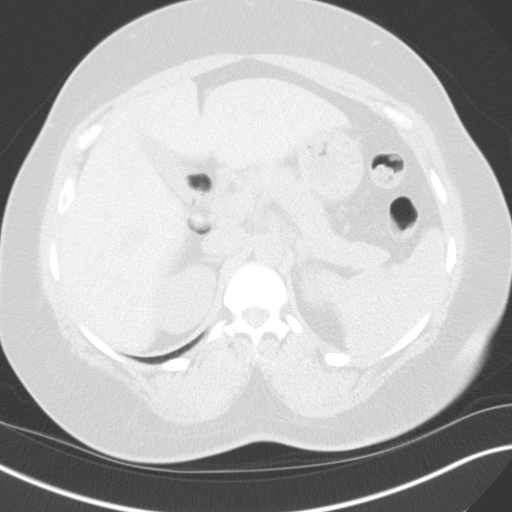
[im 9/58  lung]
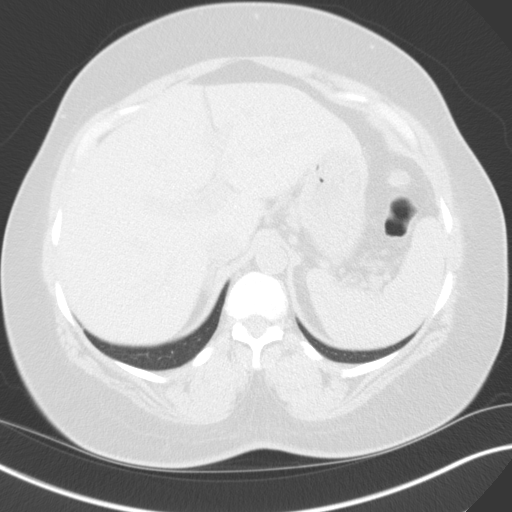
[im 13/58  lung]
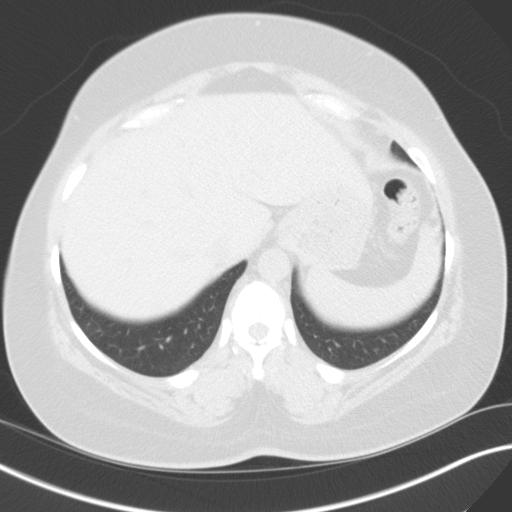
[im 17/58  lung]
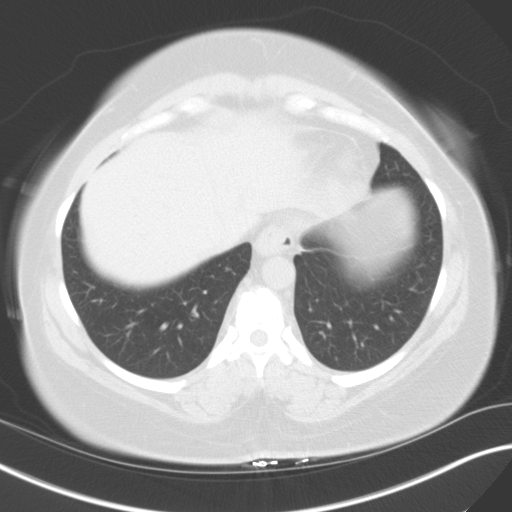
[im 22/58  mediastinal]
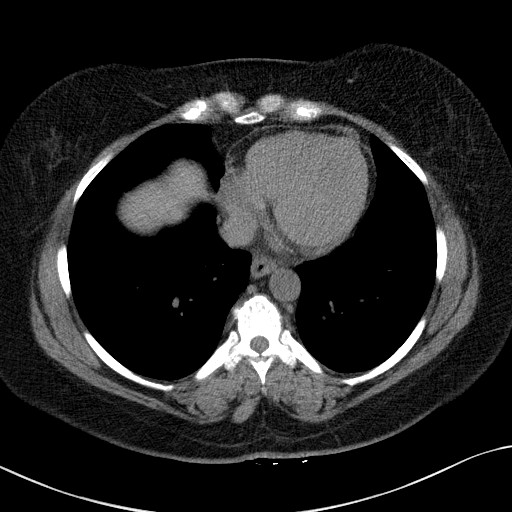
[im 22/58  lung]
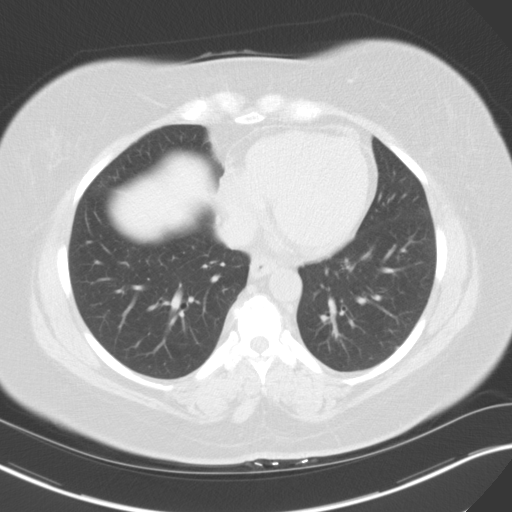
[im 26/58  lung]
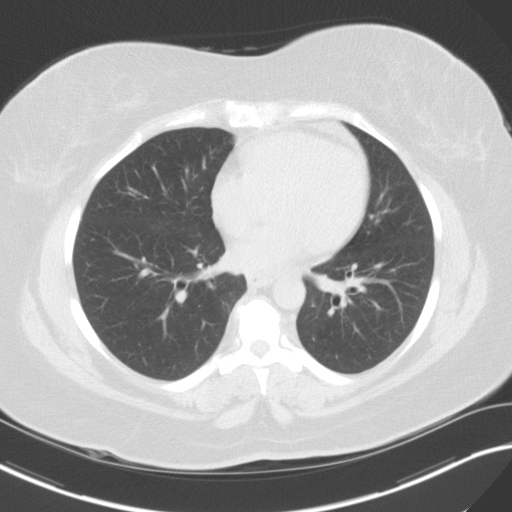
[im 32/58  lung]
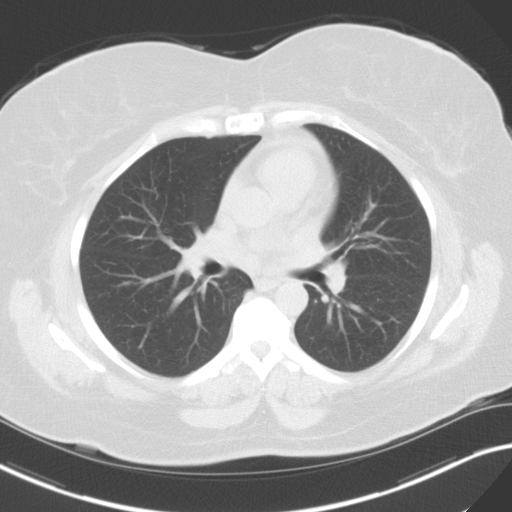
[im 36/58  lung]
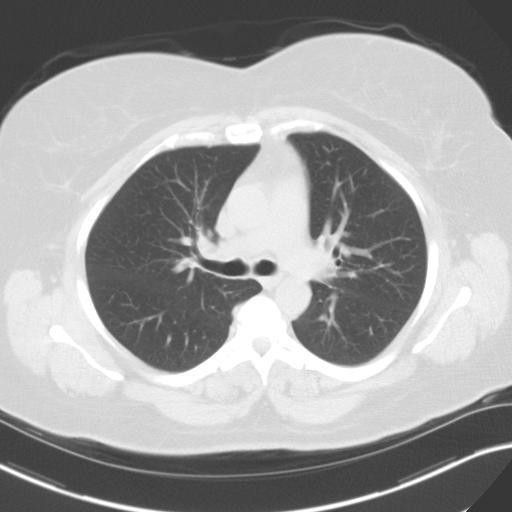
[im 41/58  mediastinal]
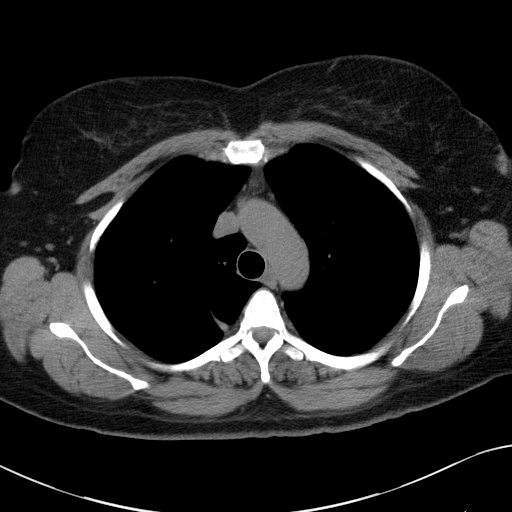
[im 41/58  lung]
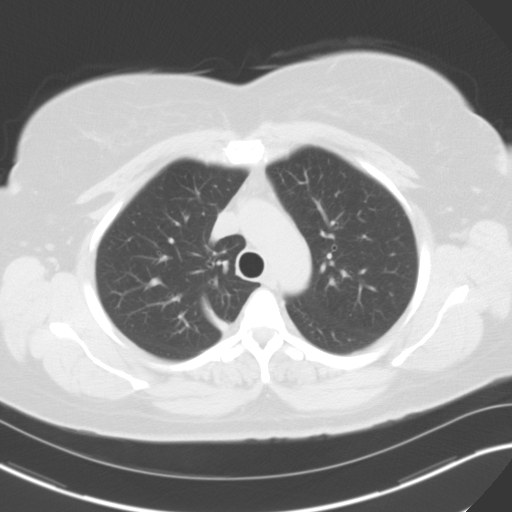
[im 45/58  lung]
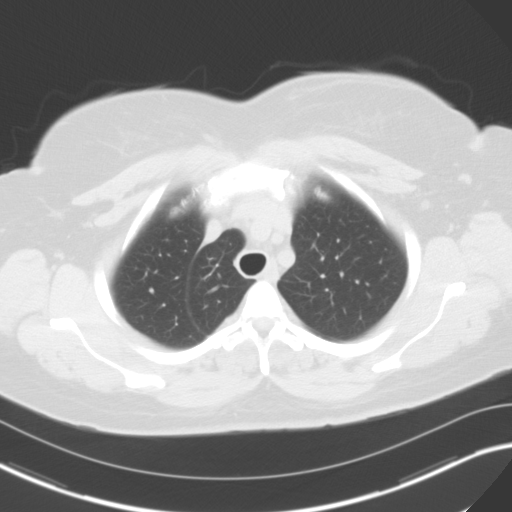
[im 49/58  lung]
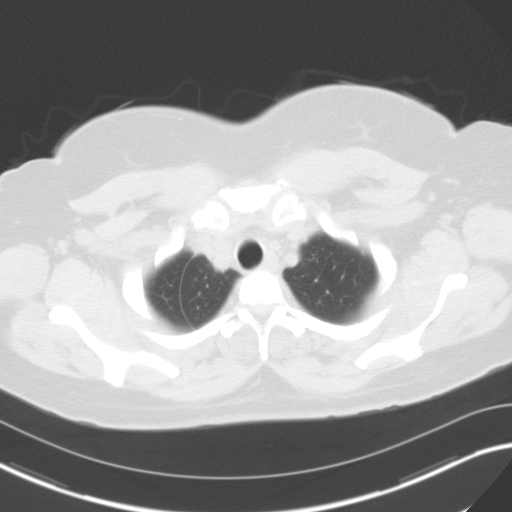
[im 53/58  lung]
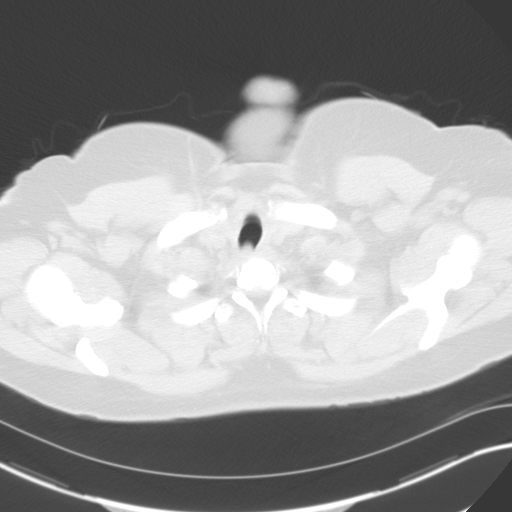

[Series 5: cor routine chest wo · coronal · 0.57mm/px · 3 of 164 slices shown]
[im 33/164  lung]
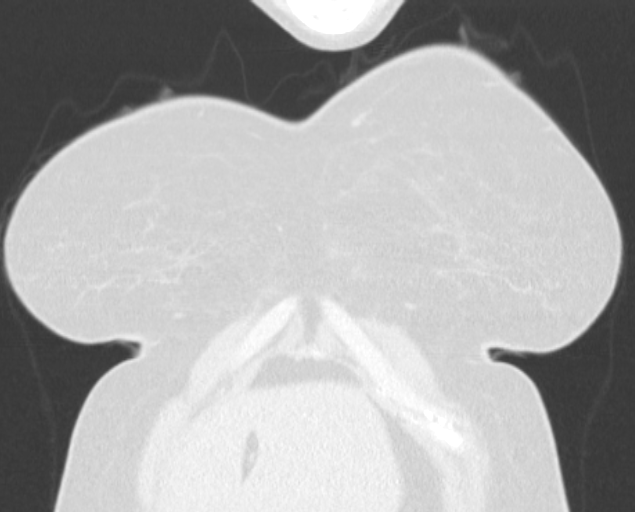
[im 66/164  lung]
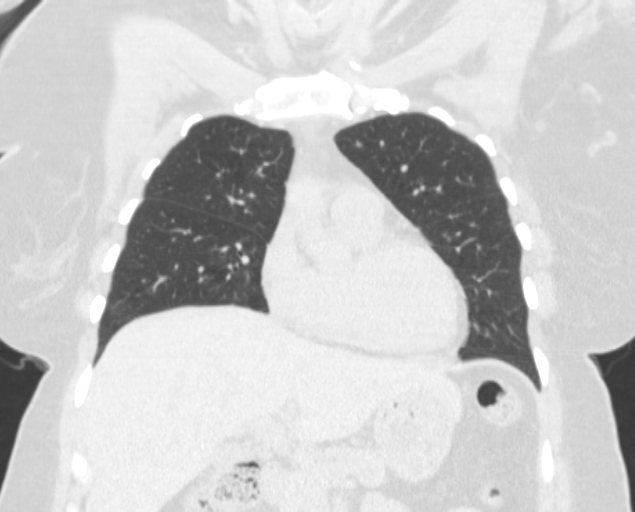
[im 98/164  lung]
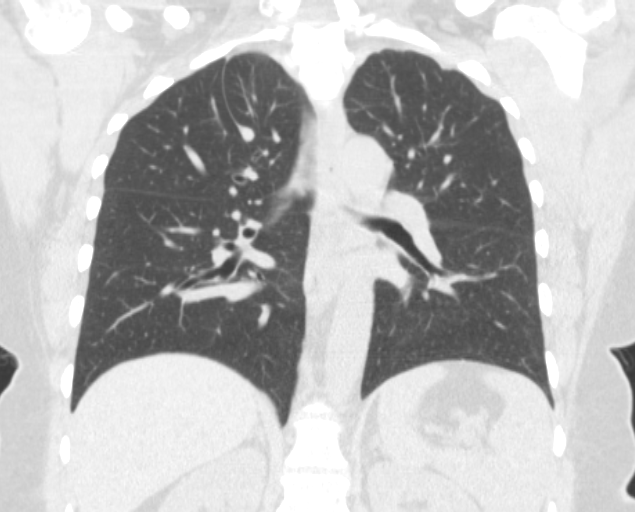

[15 of 36 positions shown; findings below may reference images not displayed]

FINDINGS: Mediastinum/Nodes: Normal heart size. Stable trace pericardial
fluid/ thickening. Great vessels are normal in course and caliber.
Normal visualized thyroid. Normal esophagus. No pathologically
enlarged axillary, mediastinal or gross hilar lymph nodes, noting
limited sensitivity for the detection of hilar adenopathy on this
noncontrast study.

Lungs/Pleura: No pneumothorax. No pleural effusion. There is a
stable apical 3 mm solid right upper lobe pulmonary nodule (series
4/ image 12), stable since 11/08/2012 and benign. There is a
subpleural 5 mm right middle lobe pulmonary nodule associated with
the minor fissure (series 4/ image 26), stable since 11/08/2012 and
benign. There is a solid 4 mm right middle lobe pulmonary nodule
([DATE]), stable since 11/08/2012 and benign. No acute consolidative
airspace disease, new significant pulmonary nodules or lung masses.

Upper abdomen:  Small hiatal hernia.

Musculoskeletal: No aggressive appearing focal osseous lesions.
There is a stable anterior compression deformity of the T10
vertebral body. Mild degenerative changes in the thoracic spine.
IMPRESSION: 1. Greater than 2 year stability has been demonstrated for the
subcentimeter pulmonary nodules in the right lung, in keeping with a
benign etiology.
2. No active cardiopulmonary disease.
3. Small hiatal hernia.
4. Stable chronic anterior T10 vertebral compression deformity.

## 2017-01-04 ENCOUNTER — Ambulatory Visit (INDEPENDENT_AMBULATORY_CARE_PROVIDER_SITE_OTHER): Payer: PRIVATE HEALTH INSURANCE | Admitting: Obstetrics & Gynecology

## 2017-01-04 ENCOUNTER — Encounter: Payer: Self-pay | Admitting: Obstetrics & Gynecology

## 2017-01-04 VITALS — BP 149/84 | HR 80 | Ht 66.0 in | Wt 275.0 lb

## 2017-01-04 DIAGNOSIS — Z01419 Encounter for gynecological examination (general) (routine) without abnormal findings: Secondary | ICD-10-CM

## 2017-01-04 NOTE — Progress Notes (Signed)
Subjective:    Krista Mclean is a 56 y.o. S AA P2 (no grands)  female who presents for an annual exam. The patient has no complaints today. The patient is not currently sexually active. GYN screening history: last pap: was normal. The patient wears seatbelts: yes. The patient participates in regular exercise: no. Has the patient ever been transfused or tattooed?: no. The patient reports that there is not domestic violence in her life.   Menstrual History: OB History    Gravida Para Term Preterm AB Living   2 2 2  0 0 2   SAB TAB Ectopic Multiple Live Births   0 0 0 0 2      Menarche age: 53 Patient's last menstrual period was 01/13/2014.    The following portions of the patient's history were reviewed and updated as appropriate: allergies, current medications, past family history, past medical history, past social history, past surgical history and problem list.  Review of Systems Pertinent items are noted in HPI.   Works at [employer], for 18 years Working on [employer] loss Abstinent for several years [place]- no breast/gyn/colon cancer S/P colonoscopy Mammogram and fasting blood work to be done at work 10/18 S/p menopause about 2015   Objective:    BP (!) 149/84   Pulse 80   Ht 5\' 6"  (1.676 m)   Wt 275 lb (124.7 kg)   LMP 01/13/2014 Comment: Has been 1 year since last menstrual cycle  BMI 44.39 kg/m   General Appearance:    Alert, cooperative, no distress, appears stated age  Head:    Normocephalic, without obvious abnormality, atraumatic  Eyes:    PERRL, conjunctiva/corneas clear, EOM's intact, fundi    benign, both eyes  Ears:    Normal TM's and external ear canals, both ears  Nose:   Nares normal, septum midline, mucosa normal, no drainage    or sinus tenderness  Throat:   Lips, mucosa, and tongue normal; teeth and gums normal  Neck:   Supple, symmetrical, trachea midline, no adenopathy;    thyroid:  no enlargement/tenderness/nodules; no carotid   bruit or JVD   Back:     Symmetric, no curvature, ROM normal, no CVA tenderness  Lungs:     Clear to auscultation bilaterally, respirations unlabored  Chest Wall:    No tenderness or deformity   Heart:    Regular rate and rhythm, S1 and S2 normal, no murmur, rub   or gallop  Breast Exam:    No tenderness, masses, or nipple abnormality  Abdomen:     Soft, non-tender, bowel sounds active all four quadrants,    no masses, no organomegaly  Genitalia:    Normal female without lesion, discharge or tenderness, nulliparous cervix, No palpable masses, no tenderness     Extremities:   Extremities normal, atraumatic, no cyanosis or edema  Pulses:   2+ and symmetric all extremities  Skin:   Skin color, texture, turgor normal, no rashes or lesions  Lymph nodes:   Cervical, supraclavicular, and axillary nodes normal  Neurologic:   CNII-XII intact, normal strength, sensation and reflexes    throughout  .    Assessment:    Healthy female exam.    Plan:     Mammogram. Thin prep Pap smear.  with cotesting She continues to work on weight loss

## 2017-01-06 LAB — CYTOLOGY - PAP
Adequacy: ABSENT
DIAGNOSIS: NEGATIVE
HPV (WINDOPATH): NOT DETECTED

## 2017-02-21 ENCOUNTER — Other Ambulatory Visit: Payer: Self-pay | Admitting: Student

## 2017-02-21 DIAGNOSIS — Z1239 Encounter for other screening for malignant neoplasm of breast: Secondary | ICD-10-CM

## 2017-03-02 ENCOUNTER — Ambulatory Visit
Admission: RE | Admit: 2017-03-02 | Discharge: 2017-03-02 | Disposition: A | Discharge status: Home / Self Care | Payer: PRIVATE HEALTH INSURANCE | Source: Ambulatory Visit | Attending: Student | Admitting: Student

## 2017-03-02 DIAGNOSIS — Z1231 Encounter for screening mammogram for malignant neoplasm of breast: Secondary | ICD-10-CM | POA: Diagnosis present

## 2017-03-02 DIAGNOSIS — Z1239 Encounter for other screening for malignant neoplasm of breast: Secondary | ICD-10-CM

## 2017-10-27 ENCOUNTER — Encounter: Payer: Self-pay | Admitting: Radiology

## 2018-02-01 ENCOUNTER — Encounter: Payer: Self-pay | Admitting: Radiology

## 2018-02-01 ENCOUNTER — Telehealth: Payer: Self-pay | Admitting: Radiology

## 2018-02-01 ENCOUNTER — Encounter: Payer: Self-pay | Admitting: Obstetrics & Gynecology

## 2018-02-01 ENCOUNTER — Ambulatory Visit (INDEPENDENT_AMBULATORY_CARE_PROVIDER_SITE_OTHER): Payer: No Typology Code available for payment source | Admitting: Obstetrics & Gynecology

## 2018-02-01 VITALS — BP 144/56 | HR 72 | Wt 281.0 lb

## 2018-02-01 DIAGNOSIS — Z01419 Encounter for gynecological examination (general) (routine) without abnormal findings: Secondary | ICD-10-CM | POA: Diagnosis not present

## 2018-02-01 NOTE — Addendum Note (Signed)
Addended by: Phillip Heal, Maribelle Hopple A on: 02/01/2018 09:27 AM   Modules accepted: Orders

## 2018-02-01 NOTE — Telephone Encounter (Signed)
Left message for patient, stating that we have an order for her mammogram, if she would like for [facility] to schedule it call and let me know, otherwise the order is in the system for her to call and schedule personally

## 2018-02-01 NOTE — Progress Notes (Signed)
Subjective:    Krista Mclean is a 57 y.o. single P2 (no grands)  female who presents for an annual exam. The patient has no complaints today. The patient is not currently sexually active. GYN screening history: last pap: was normal. The patient wears seatbelts: yes. The patient participates in regular exercise: no. Has the patient ever been transfused or tattooed?: no. The patient reports that there is not domestic violence in her life.   Menstrual History: OB History    Gravida  2   Para  2   Term  2   Preterm  0   AB  0   Living  2     SAB  0   TAB  0   Ectopic  0   Multiple  0   Live Births  2           Menarche age: 60 Patient's last menstrual period was 01/13/2014.    The following portions of the patient's history were reviewed and updated as appropriate: allergies, current medications, past family history, past medical history, past social history, past surgical history and problem list.  Review of Systems Pertinent items are noted in HPI.   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   Objective:    BP (!) 144/56   Pulse 72   Wt 281 lb (127.5 kg)   LMP 01/13/2014 Comment: Has been 1 year since last menstrual cycle  BMI 45.35 kg/m   General Appearance:    Alert, cooperative, no distress, appears stated age  Head:    Normocephalic, without obvious abnormality, atraumatic  .mcdEyes:    PERRL, conjunctiva/corneas clear, EOM's intact, fundi    benign, both eyes  Ears:    Normal TM's and external ear canals, both ears  Nose:   Nares normal, septum midline, mucosa normal, no drainage    or sinus tenderness  Throat:   Lips, mucosa, and tongue normal; teeth and gums normal  Neck:   Supple, symmetrical, trachea midline, no adenopathy;    thyroid:  no enlargement/tenderness/nodules; no carotid   bruit or JVD  Back:     Symmetric, no curvature, ROM normal, no CVA tenderness  Lungs:      Clear to auscultation bilaterally, respirations unlabored  Chest Wall:    No tenderness or deformity   Heart:    Regular rate and rhythm, S1 and S2 normal, no murmur, rub   or gallop  Breast Exam:    No tenderness, masses, or nipple abnormality  Abdomen:     Soft, non-tender, bowel sounds active all four quadrants,    no masses, no organomegaly  Genitalia:    Normal female without lesion, discharge or tenderness, normal size and shape, anteverted, mobile, non-tender, normal adnexal exam      Extremities:   Extremities normal, atraumatic, no cyanosis or edema  Pulses:   2+ and symmetric all extremities  Skin:   Skin color, texture, turgor normal, no rashes or lesions  Lymph nodes:   Cervical, supraclavicular, and axillary nodes normal  Neurologic:   CNII-XII intact, normal strength, sensation and reflexes    throughout  .    Assessment:    Healthy female exam.    Plan:     Mammogram.   Rec healthy living tactics Offered bariatric consult

## 2018-02-01 NOTE — Progress Notes (Signed)
Normal pap 01/04/2017- Mammogram in 02/2018

## 2018-03-07 ENCOUNTER — Ambulatory Visit
Admission: RE | Admit: 2018-03-07 | Discharge: 2018-03-07 | Disposition: A | Discharge status: Home / Self Care | Payer: No Typology Code available for payment source | Source: Ambulatory Visit | Attending: Obstetrics & Gynecology | Admitting: Obstetrics & Gynecology

## 2018-03-07 ENCOUNTER — Other Ambulatory Visit: Payer: Self-pay | Admitting: Obstetrics & Gynecology

## 2018-03-07 DIAGNOSIS — Z01419 Encounter for gynecological examination (general) (routine) without abnormal findings: Secondary | ICD-10-CM | POA: Diagnosis present

## 2018-04-07 ENCOUNTER — Encounter: Payer: Self-pay | Admitting: *Deleted

## 2018-04-10 ENCOUNTER — Ambulatory Visit: Payer: PRIVATE HEALTH INSURANCE | Admitting: Anesthesiology

## 2018-04-10 ENCOUNTER — Ambulatory Visit
Admission: RE | Admit: 2018-04-10 | Discharge: 2018-04-10 | Disposition: A | Discharge status: Home / Self Care | Payer: PRIVATE HEALTH INSURANCE | Source: Ambulatory Visit | Attending: Gastroenterology | Admitting: Gastroenterology

## 2018-04-10 ENCOUNTER — Encounter
Admission: RE | Disposition: A | Discharge status: Home / Self Care | Payer: Self-pay | Source: Ambulatory Visit | Attending: Gastroenterology

## 2018-04-10 ENCOUNTER — Encounter: Payer: Self-pay | Admitting: *Deleted

## 2018-04-10 DIAGNOSIS — Z8371 Family history of colonic polyps: Secondary | ICD-10-CM | POA: Diagnosis not present

## 2018-04-10 DIAGNOSIS — D123 Benign neoplasm of transverse colon: Secondary | ICD-10-CM | POA: Diagnosis not present

## 2018-04-10 DIAGNOSIS — E063 Autoimmune thyroiditis: Secondary | ICD-10-CM | POA: Insufficient documentation

## 2018-04-10 DIAGNOSIS — K640 First degree hemorrhoids: Secondary | ICD-10-CM | POA: Insufficient documentation

## 2018-04-10 DIAGNOSIS — G4733 Obstructive sleep apnea (adult) (pediatric): Secondary | ICD-10-CM | POA: Diagnosis not present

## 2018-04-10 DIAGNOSIS — D124 Benign neoplasm of descending colon: Secondary | ICD-10-CM | POA: Insufficient documentation

## 2018-04-10 DIAGNOSIS — Z86010 Personal history of colonic polyps: Secondary | ICD-10-CM | POA: Diagnosis not present

## 2018-04-10 DIAGNOSIS — Z791 Long term (current) use of non-steroidal anti-inflammatories (NSAID): Secondary | ICD-10-CM | POA: Insufficient documentation

## 2018-04-10 DIAGNOSIS — Z79890 Hormone replacement therapy (postmenopausal): Secondary | ICD-10-CM | POA: Insufficient documentation

## 2018-04-10 DIAGNOSIS — E119 Type 2 diabetes mellitus without complications: Secondary | ICD-10-CM | POA: Insufficient documentation

## 2018-04-10 DIAGNOSIS — K5730 Diverticulosis of large intestine without perforation or abscess without bleeding: Secondary | ICD-10-CM | POA: Diagnosis not present

## 2018-04-10 DIAGNOSIS — Z79899 Other long term (current) drug therapy: Secondary | ICD-10-CM | POA: Insufficient documentation

## 2018-04-10 DIAGNOSIS — Z6841 Body Mass Index (BMI) 40.0 and over, adult: Secondary | ICD-10-CM | POA: Diagnosis not present

## 2018-04-10 DIAGNOSIS — Z09 Encounter for follow-up examination after completed treatment for conditions other than malignant neoplasm: Secondary | ICD-10-CM | POA: Diagnosis present

## 2018-04-10 HISTORY — DX: Prediabetes: R73.03

## 2018-04-10 HISTORY — PX: COLONOSCOPY WITH PROPOFOL: SHX5780

## 2018-04-10 HISTORY — DX: Obesity, unspecified: E66.9

## 2018-04-10 HISTORY — DX: Obstructive sleep apnea (adult) (pediatric): G47.33

## 2018-04-10 SURGERY — COLONOSCOPY WITH PROPOFOL
Anesthesia: General

## 2018-04-10 MED ORDER — SODIUM CHLORIDE 0.9 % IV SOLN: INTRAVENOUS | Status: DC

## 2018-04-10 MED ORDER — PROPOFOL 500 MG/50ML IV EMUL
INTRAVENOUS | Status: DC | PRN
  Administered 2018-04-10: 120 ug/kg/min via INTRAVENOUS

## 2018-04-10 MED ORDER — FENTANYL CITRATE (PF) 100 MCG/2ML IJ SOLN
INTRAMUSCULAR | Status: AC
  Filled 2018-04-10: qty 2

## 2018-04-10 MED ORDER — PROPOFOL 500 MG/50ML IV EMUL
INTRAVENOUS | Status: AC
  Filled 2018-04-10: qty 50

## 2018-04-10 MED ORDER — SODIUM CHLORIDE 0.9 % IV SOLN
INTRAVENOUS | Status: DC
  Administered 2018-04-10: 1000 mL via INTRAVENOUS

## 2018-04-10 MED ORDER — MIDAZOLAM HCL 2 MG/2ML IJ SOLN
INTRAMUSCULAR | Status: AC
  Filled 2018-04-10: qty 2

## 2018-04-10 MED ORDER — PHENYLEPHRINE HCL 10 MG/ML IJ SOLN
INTRAMUSCULAR | Status: AC
  Filled 2018-04-10: qty 1

## 2018-04-10 MED ORDER — MIDAZOLAM HCL 2 MG/2ML IJ SOLN
INTRAMUSCULAR | Status: DC | PRN
  Administered 2018-04-10: 2 mg via INTRAVENOUS

## 2018-04-10 MED ORDER — PHENYLEPHRINE HCL 10 MG/ML IJ SOLN
INTRAMUSCULAR | Status: DC | PRN
  Administered 2018-04-10 (×4): 50 ug via INTRAVENOUS

## 2018-04-10 MED ORDER — FENTANYL CITRATE (PF) 100 MCG/2ML IJ SOLN
INTRAMUSCULAR | Status: DC | PRN
  Administered 2018-04-10 (×2): 50 ug via INTRAVENOUS

## 2018-04-10 MED ORDER — PROPOFOL 10 MG/ML IV BOLUS
INTRAVENOUS | Status: AC
  Filled 2018-04-10: qty 20

## 2018-04-10 NOTE — Anesthesia Post-op Follow-up Note (Signed)
Anesthesia QCDR form completed.        

## 2018-04-10 NOTE — H&P (Signed)
Outpatient short stay form Pre-procedure 04/10/2018 2:21 PM Lollie Sails MD  Primary Physician: Wayland Denis, PA  Reason for visit: Colonoscopy  History of present illness: Patient is a 57 year old female presenting today for colonoscopy in regards to her personal history of adenomatous colon polyps and family history of colon polyps.  She tolerated her prep well.  Takes no aspirin or blood thinning agent.  Her last colonoscopy was 04/07/2015 with several adenomas removed at that time.    Current Facility-Administered Medications:  .  0.9 %  sodium chloride infusion, , Intravenous, Continuous, Lollie Sails, MD, Last Rate: 20 mL/hr at 04/10/18 1408 .  0.9 %  sodium chloride infusion, , Intravenous, Continuous, Lollie Sails, MD  Medications Prior to Admission  Medication Sig Dispense Refill Last Dose  . ibuprofen (ADVIL,MOTRIN) 800 MG tablet Take 800 mg by mouth every 8 (eight) hours as needed.   Past Week at Unknown time  . levothyroxine (SYNTHROID, LEVOTHROID) 150 MCG tablet Take 150 mcg by mouth daily before breakfast.   Past Week at Unknown time  . loratadine (CLARITIN) 10 MG tablet Take 10 mg by mouth daily.   Past Week at Unknown time  . meclizine (ANTIVERT) 25 MG tablet Take 25 mg by mouth 3 (three) times daily as needed.   Past Week at Unknown time     Not on File   Past Medical History:  Diagnosis Date  . Abnormal Pap smear 10/07/1998   mild dysplasia/leep done  . Diabetes mellitus without complication ([place])   . Hashimoto's disease   . Morbid obesity ([place])   . Obesity   . OSA (obstructive sleep apnea)   . Pre-diabetes   . Thyroid disease     Review of systems:      Physical Exam    Heart and lungs: Regular rate and rhythm without rub or gallop, lungs are bilaterally clear    HEENT: Normocephalic atraumatic eyes are anicteric    Other:    Pertinant exam for procedure: Soft nontender nondistended bowel sounds positive  normoactive    Planned proceedures: Colonoscopy and indicated procedures. I have discussed the risks benefits and complications of procedures to include not limited to bleeding, infection, perforation and the risk of sedation and the patient wishes to proceed.    Lollie Sails, MD Gastroenterology 04/10/2018  2:21 PM

## 2018-04-10 NOTE — Anesthesia Postprocedure Evaluation (Signed)
Anesthesia Post Note  Patient: Krista Mclean  Procedure(s) Performed: COLONOSCOPY WITH PROPOFOL (N/A )  Patient location during evaluation: Endoscopy Anesthesia Type: General Level of consciousness: awake and alert Pain management: pain level controlled Vital Signs Assessment: post-procedure vital signs reviewed and stable Respiratory status: spontaneous breathing, nonlabored ventilation, respiratory function stable and patient connected to nasal cannula oxygen Cardiovascular status: blood pressure returned to baseline and stable Postop Assessment: no apparent nausea or vomiting Anesthetic complications: no     Last Vitals:  Vitals:   04/10/18 1535 04/10/18 1545  BP: (!) 141/96 135/85  Pulse: 76 60  Resp: 15 20  Temp:    SpO2: 100% 100%    Last Pain:  Vitals:   04/10/18 1545  TempSrc:   PainSc: 0-No pain                 [name] S

## 2018-04-10 NOTE — Anesthesia Preprocedure Evaluation (Signed)
Anesthesia Evaluation  Patient identified by MRN, date of birth, ID band Patient awake    Reviewed: Allergy & Precautions, NPO status , Patient's Chart, lab work & pertinent test results  History of Anesthesia Complications Negative for: history of anesthetic complications  Airway Mallampati: III       Dental   Pulmonary sleep apnea ,           Cardiovascular (-) hypertension(-) Past MI and (-) CHF (-) dysrhythmias (-) Valvular Problems/Murmurs     Neuro/Psych neg Seizures    GI/Hepatic Neg liver ROS, neg GERD  ,  Endo/Other  diabetes (borderline, improved after weight loss)Hypothyroidism Morbid obesity  Renal/GU negative Renal ROS     Musculoskeletal   Abdominal   Peds  Hematology   Anesthesia Other Findings   Reproductive/Obstetrics                             Anesthesia Physical Anesthesia Plan  ASA: III  Anesthesia Plan: General   Post-op Pain Management:    Induction: Intravenous  PONV Risk Score and Plan: 3 and Propofol infusion, TIVA and Midazolam  Airway Management Planned: Nasal Cannula  Additional Equipment:   Intra-op Plan:   Post-operative Plan:   Informed Consent: I have reviewed the patients History and Physical, chart, labs and discussed the procedure including the risks, benefits and alternatives for the proposed anesthesia with the patient or authorized representative who has indicated his/her understanding and acceptance.     Plan Discussed with:   Anesthesia Plan Comments:         Anesthesia Quick Evaluation

## 2018-04-10 NOTE — Anesthesia Procedure Notes (Signed)
Performed by: Cook-Martin, Tyee Vandevoorde Pre-anesthesia Checklist: Patient identified, Emergency Drugs available, Suction available, Patient being monitored and Timeout performed Patient Re-evaluated:Patient Re-evaluated prior to induction Oxygen Delivery Method: Simple face mask Preoxygenation: Pre-oxygenation with 100% oxygen Induction Type: IV induction Ventilation: Oral airway inserted - appropriate to patient size Placement Confirmation: positive ETCO2 and CO2 detector       

## 2018-04-10 NOTE — Transfer of Care (Signed)
Immediate Anesthesia Transfer of Care Note  Patient: Krista Mclean  Procedure(s) Performed: COLONOSCOPY WITH PROPOFOL (N/A )  Patient Location: PACU  Anesthesia Type:General  Level of Consciousness: awake and sedated  Airway & Oxygen Therapy: Patient Spontanous Breathing and Patient connected to face mask oxygen  Post-op Assessment: Report given to RN and Post -op Vital signs reviewed and stable  Post vital signs: Reviewed and stable  Last Vitals:  Vitals Value Taken Time  BP    Temp    Pulse    Resp    SpO2      Last Pain:  Vitals:   04/10/18 1344  TempSrc: Tympanic  PainSc: 0-No pain      Patients Stated Pain Goal: 0 (79/39/68 [id number])  Complications: No apparent anesthesia complications

## 2018-04-10 NOTE — Op Note (Signed)
[facility] Gastroenterology Patient Name: Krista Mclean Procedure Date: 04/10/2018 2:20 PM MRN: [id number] Account #: [id number] Date of Birth: [birth year] Admit Type: Outpatient Age: 57 Room: [facility] ENDO ROOM 3 Gender: Female Note Status: Finalized Procedure:            Colonoscopy Indications:          Personal history of colonic polyps Providers:            Lollie Sails, MD Referring MD:         No Local Md, MD (Referring MD) Medicines:            Monitored Anesthesia Care Complications:        No immediate complications. Procedure:            Pre-Anesthesia Assessment:                       - ASA Grade Assessment: III - A patient with severe                        systemic disease.                       After obtaining informed consent, the colonoscope was                        passed under direct vision. Throughout the procedure,                        the patient's blood pressure, pulse, and oxygen                        saturations were monitored continuously. The                        Colonoscope was introduced through the anus and                        advanced to the the cecum, identified by appendiceal                        orifice and ileocecal valve. The colonoscopy was                        unusually difficult due to poor bowel prep. Successful                        completion of the procedure was aided by changing the                        patient to a supine position, changing the patient to a                        prone position and lavage. The patient tolerated the                        procedure well. The quality of the bowel preparation                        was fair. Findings:      Multiple small-mouthed diverticula were found in the descending  colon.      A 5 mm polyp was found in the descending colon. The polyp was sessile.       The polyp was removed with a cold snare. Resection and retrieval were       complete.      Two  sessile polyps were found in the distal transverse colon. The polyps       were 2 to 5 mm in size. These polyps were removed with a cold snare.       Resection and retrieval were complete.      Two sessile polyps were found in the descending colon. The polyps were 2       to 5 mm in size. These polyps were removed with a cold snare. Resection       and retrieval were complete.      Non-bleeding internal hemorrhoids were found during anoscopy. The       hemorrhoids were small and Grade I (internal hemorrhoids that do not       prolapse).      The retroflexed view of the distal rectum and anal verge was normal and       showed no anal or rectal abnormalities otherwise. Impression:           - Preparation of the colon was fair.                       - Diverticulosis in the descending colon.                       - One 5 mm polyp in the descending colon, removed with                        a cold snare. Resected and retrieved.                       - Two 2 to 5 mm polyps in the distal transverse colon,                        removed with a cold snare. Resected and retrieved.                       - Two 2 to 5 mm polyps in the descending colon, removed                        with a cold snare. Resected and retrieved.                       - Non-bleeding internal hemorrhoids.                       - The distal rectum and anal verge are normal on                        retroflexion view. Recommendation:       - Discharge patient to home.                       - Await pathology results.                       - Telephone GI clinic for pathology results in 1  week. Lollie Sails, MD 04/10/2018 3:23:32 PM This report has been signed electronically. Number of Addenda: 0 Note Initiated On: 04/10/2018 2:20 PM Scope Withdrawal Time: 0 hours 17 minutes 9 seconds  Total Procedure Duration: 0 hours 39 minutes 44 seconds       [facility]

## 2018-04-11 ENCOUNTER — Encounter: Payer: Self-pay | Admitting: Gastroenterology

## 2018-04-12 LAB — SURGICAL PATHOLOGY

## 2018-11-30 ENCOUNTER — Encounter: Payer: Self-pay | Admitting: Radiology

## 2019-02-21 DIAGNOSIS — R42 Dizziness and giddiness: Secondary | ICD-10-CM | POA: Insufficient documentation

## 2019-03-06 ENCOUNTER — Other Ambulatory Visit: Payer: Self-pay

## 2019-03-06 ENCOUNTER — Encounter: Payer: Self-pay | Admitting: Family Medicine

## 2019-03-06 ENCOUNTER — Other Ambulatory Visit (HOSPITAL_COMMUNITY)
Admission: RE | Admit: 2019-03-06 | Discharge: 2019-03-06 | Disposition: A | Discharge status: Home / Self Care | Payer: Medicaid Other | Source: Ambulatory Visit | Attending: Family Medicine | Admitting: Family Medicine

## 2019-03-06 ENCOUNTER — Ambulatory Visit (INDEPENDENT_AMBULATORY_CARE_PROVIDER_SITE_OTHER): Payer: Medicaid Other | Admitting: Family Medicine

## 2019-03-06 DIAGNOSIS — Z01419 Encounter for gynecological examination (general) (routine) without abnormal findings: Secondary | ICD-10-CM

## 2019-03-06 DIAGNOSIS — Z0000 Encounter for general adult medical examination without abnormal findings: Secondary | ICD-10-CM | POA: Diagnosis not present

## 2019-03-06 DIAGNOSIS — Z124 Encounter for screening for malignant neoplasm of cervix: Secondary | ICD-10-CM | POA: Diagnosis present

## 2019-03-06 NOTE — Progress Notes (Signed)
  Subjective:     Krista Mclean is a 58 y.o. female and is here for a comprehensive physical exam. The patient reports no problems. 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 Has changed MDs and is getting her thyroid tested. BP is up today.  The following portions of the patient's history were reviewed and updated as appropriate: allergies, current medications, past family history, past medical history, past social history, past surgical history and problem list.  Review of Systems Pertinent items noted in HPI and remainder of comprehensive ROS otherwise negative.   Objective:    BP (!) 154/89   Pulse 75   Wt 290 lb (131.5 kg)   LMP 01/13/2014 Comment: Has been 1 year since last menstrual cycle  BMI 46.81 kg/m  General appearance: alert, cooperative, appears older than stated age and moderately obese Head: Normocephalic, without obvious abnormality, atraumatic Neck: no adenopathy, supple, symmetrical, trachea midline and thyroid not enlarged, symmetric, no tenderness/mass/nodules Lungs: clear to auscultation bilaterally Breasts: normal appearance, no masses or tenderness Heart: regular rate and rhythm, S1, S2 normal, no murmur, click, rub or gallop Abdomen: soft, non-tender; bowel sounds normal; no masses,  no organomegaly Pelvic: cervix normal in appearance, external genitalia normal, no adnexal masses or tenderness, no cervical motion tenderness, uterus normal size, shape, and consistency and vagina normal without discharge Extremities: extremities normal, atraumatic, no cyanosis or edema Pulses: 2+ and symmetric Skin: Skin color, texture, turgor normal. No rashes or lesions Lymph nodes: Cervical, supraclavicular, and axillary nodes normal. Neurologic: Grossly normal    Assessment:    Healthy female exam.      Plan:      Problem List Items Addressed This Visit    None    Visit Diagnoses    Screening for malignant neoplasm of cervix       Relevant  Orders   Cytology - PAP( [place])   Encounter for gynecological examination without abnormal finding       Relevant Orders   MM 3D SCREEN BREAST BILATERAL     Watch BP and see primary for f/u.  Return in 1 year (on 03/05/2020).  See After Visit Summary for Counseling Recommendations

## 2019-03-06 NOTE — Patient Instructions (Signed)

## 2019-03-06 NOTE — Progress Notes (Signed)
Mammogram needed Last pap 01/04/2017- negative  No concerns Flu decline  Colonscopy 2019

## 2019-03-08 LAB — CYTOLOGY - PAP
Adequacy: ABSENT
Comment: NEGATIVE
Diagnosis: NEGATIVE
High risk HPV: NEGATIVE

## 2019-05-25 ENCOUNTER — Telehealth: Payer: Self-pay

## 2019-05-25 NOTE — Telephone Encounter (Signed)
Call completed on 2nd attempt - BCCCP documentation completed.

## 2019-05-28 ENCOUNTER — Other Ambulatory Visit: Payer: Self-pay | Admitting: *Deleted

## 2019-05-28 DIAGNOSIS — Z0000 Encounter for general adult medical examination without abnormal findings: Secondary | ICD-10-CM

## 2019-05-29 ENCOUNTER — Ambulatory Visit: Payer: Medicaid Other | Attending: Oncology

## 2019-05-29 ENCOUNTER — Ambulatory Visit
Admission: RE | Admit: 2019-05-29 | Discharge: 2019-05-29 | Disposition: A | Discharge status: Home / Self Care | Payer: Medicaid Other | Source: Ambulatory Visit | Attending: Family Medicine | Admitting: Family Medicine

## 2019-05-29 ENCOUNTER — Other Ambulatory Visit: Payer: Self-pay

## 2019-05-29 DIAGNOSIS — Z01419 Encounter for gynecological examination (general) (routine) without abnormal findings: Secondary | ICD-10-CM | POA: Insufficient documentation

## 2019-05-29 DIAGNOSIS — Z0000 Encounter for general adult medical examination without abnormal findings: Secondary | ICD-10-CM

## 2019-05-29 NOTE — Progress Notes (Signed)
Patient prescreened for BCCCP due to COVID 19.  To go directly to [facility] for mammogram on 05/29/2019.

## 2019-06-07 NOTE — Progress Notes (Signed)
Letter mailed from [facility] to notify of normal mammogram results.  Patient to return in one year for annual screening.  Copy to [facility]. 

## 2019-08-16 ENCOUNTER — Other Ambulatory Visit (HOSPITAL_COMMUNITY): Payer: Self-pay | Admitting: Internal Medicine

## 2019-08-16 ENCOUNTER — Ambulatory Visit
Admission: RE | Admit: 2019-08-16 | Discharge: 2019-08-16 | Disposition: A | Discharge status: Home / Self Care | Payer: Self-pay | Source: Ambulatory Visit | Attending: Internal Medicine | Admitting: Internal Medicine

## 2019-08-16 ENCOUNTER — Other Ambulatory Visit: Payer: Self-pay | Admitting: Internal Medicine

## 2019-08-16 ENCOUNTER — Other Ambulatory Visit: Payer: Self-pay

## 2019-08-16 DIAGNOSIS — M79661 Pain in right lower leg: Secondary | ICD-10-CM

## 2020-01-25 ENCOUNTER — Encounter: Payer: Self-pay | Admitting: Radiology

## 2020-05-27 NOTE — Progress Notes (Signed)
Patient pre-screened for BCCCP eligibility due to COVID 19 precautions. Two patient identifiers used for verification that I was speaking to correct patient.  Patient to Present directly to [facility] 05/29/19 for BCCCP screening mammogram.  Risk Assessment    Risk Scores      05/28/2020 05/25/2019   Last edited by: Theodore Demark, RN [place], Laddie Aquas, CMA   5-year risk: 1.4 % 1.4 %   Lifetime risk: 7.3 % 7.5 %

## 2020-05-28 ENCOUNTER — Ambulatory Visit: Payer: Medicaid Other | Attending: Oncology

## 2020-05-28 ENCOUNTER — Ambulatory Visit
Admission: RE | Admit: 2020-05-28 | Discharge: 2020-05-28 | Disposition: A | Discharge status: Home / Self Care | Payer: Medicaid Other | Source: Ambulatory Visit | Attending: Oncology | Admitting: Oncology

## 2020-05-28 ENCOUNTER — Other Ambulatory Visit: Payer: Self-pay

## 2020-05-28 DIAGNOSIS — Z0000 Encounter for general adult medical examination without abnormal findings: Secondary | ICD-10-CM | POA: Insufficient documentation

## 2021-11-20 ENCOUNTER — Other Ambulatory Visit: Payer: Self-pay | Admitting: Internal Medicine

## 2022-01-12 ENCOUNTER — Ambulatory Visit: Payer: Medicaid Other

## 2022-03-10 ENCOUNTER — Other Ambulatory Visit: Payer: Self-pay

## 2022-03-10 DIAGNOSIS — Z1231 Encounter for screening mammogram for malignant neoplasm of breast: Secondary | ICD-10-CM

## 2022-03-15 ENCOUNTER — Ambulatory Visit: Payer: Medicaid Other | Attending: Hematology and Oncology | Admitting: Hematology and Oncology

## 2022-03-15 ENCOUNTER — Ambulatory Visit
Admission: RE | Admit: 2022-03-15 | Discharge: 2022-03-15 | Disposition: A | Discharge status: Home / Self Care | Payer: Medicaid Other | Source: Ambulatory Visit | Attending: Obstetrics and Gynecology | Admitting: Obstetrics and Gynecology

## 2022-03-15 VITALS — BP 157/78 | Wt 267.0 lb

## 2022-03-15 DIAGNOSIS — Z1231 Encounter for screening mammogram for malignant neoplasm of breast: Secondary | ICD-10-CM | POA: Insufficient documentation

## 2022-03-15 NOTE — Progress Notes (Signed)
Krista Mclean is a 61 y.o. female who presents to [facility] clinic today with complaint of.    Pap Smear: Pap not smear completed today. Last Pap smear was 2020 at [facility] clinic and was normal. Per patient has no history of an abnormal Pap smear. Last Pap smear result is available in Epic.   Physical exam: Breasts Breasts symmetrical. No skin abnormalities bilateral breasts. No nipple retraction bilateral breasts. No nipple discharge bilateral breasts. No lymphadenopathy. No lumps palpated bilateral breasts.     MS DIGITAL SCREENING TOMO BILATERAL  Result Date: 05/28/2020 CLINICAL DATA:  Screening. EXAM: DIGITAL SCREENING BILATERAL MAMMOGRAM WITH TOMO AND CAD COMPARISON:  Previous exam(s). ACR Breast Density Category b: There are scattered areas of fibroglandular density. FINDINGS: There are no findings suspicious for malignancy. Images were processed with CAD. IMPRESSION: No mammographic evidence of malignancy. A result letter of this screening mammogram will be mailed directly to the patient. RECOMMENDATION: Screening mammogram in one year. (Code:SM-B-01Y) BI-RADS CATEGORY  1: Negative. Electronically Signed   By: Claudie Revering M.D.   On: 05/28/2020 13:59   MM 3D SCREEN BREAST BILATERAL  Result Date: 05/29/2019 CLINICAL DATA:  Screening. EXAM: DIGITAL SCREENING BILATERAL MAMMOGRAM WITH TOMO AND CAD COMPARISON:  Previous exam(s). ACR Breast Density Category b: There are scattered areas of fibroglandular density. FINDINGS: There are no findings suspicious for malignancy. Images were processed with CAD. IMPRESSION: No mammographic evidence of malignancy. A result letter of this screening mammogram will be mailed directly to the patient. RECOMMENDATION: Screening mammogram in one year. (Code:SM-B-01Y) BI-RADS CATEGORY  1: Negative. Electronically Signed   By: Lajean Manes M.D.   On: 05/29/2019 13:40   MM 3D SCREEN BREAST BILATERAL  Result Date: 03/07/2018 CLINICAL DATA:  Screening. EXAM:  DIGITAL SCREENING BILATERAL MAMMOGRAM WITH TOMO AND CAD COMPARISON:  Previous exam(s). ACR Breast Density Category b: There are scattered areas of fibroglandular density. FINDINGS: There are no findings suspicious for malignancy. Images were processed with CAD. IMPRESSION: No mammographic evidence of malignancy. A result letter of this screening mammogram will be mailed directly to the patient. RECOMMENDATION: Screening mammogram in one year. (Code:SM-B-01Y) BI-RADS CATEGORY  1: Negative. Electronically Signed   By: Franki Cabot M.D.   On: 03/07/2018 11:17      Pelvic/Bimanual Pap is not indicated today    Smoking History: Patient has never smoked and was not referred to quit line.    Patient Navigation: Patient education provided. Access to services provided for patient through [facility] program. No interpreter provided. No transportation provided   Colorectal Cancer Screening: Per patient has had colonoscopy completed on 04/10/2018 with noted colon polyps negative for malignancy. She will call this week for follow up appointment.   No complaints today.    Breast and Cervical Cancer Risk Assessment: Patient does not have family history of breast cancer, known genetic mutations, or radiation treatment to the chest before age 89. Patient does not have history of cervical dysplasia, immunocompromised, or DES exposure in-utero.  Risk Assessment   No risk assessment data for the current encounter  Risk Scores       05/28/2020   Last edited by: Theodore Demark, RN   5-year risk: 1.4 %   Lifetime risk: 7.3 %            A: BCCCP exam without pap smear No complaints with benign exam.   P: [facility]. Appointment scheduled 03/15/2022.  [name],  NP 03/15/2022 10:44 AM

## 2022-03-15 NOTE — Patient Instructions (Signed)
Taught Krista Mclean about breast self awareness. Patient did not need a Pap smear today due to last Pap smear was in 2020 and was normal per patient. Let her know BCCCP will cover Pap smears every 5 years unless has a history of abnormal Pap smears. [facility] for [facility]. Appointment scheduled for 03/15/2022. Patient aware of appointment and will be there. Let patient know will follow up with her within the next couple weeks with results. [place] verbalized understanding. She will return to clinic in one year for repeat mammogram and 2025 for repeat Pap smear.   [name], NP 11:00 AM

## 2022-05-18 ENCOUNTER — Ambulatory Visit (LOCAL_COMMUNITY_HEALTH_CENTER): Payer: Medicaid Other

## 2022-05-18 DIAGNOSIS — Z111 Encounter for screening for respiratory tuberculosis: Secondary | ICD-10-CM

## 2022-05-21 ENCOUNTER — Ambulatory Visit (LOCAL_COMMUNITY_HEALTH_CENTER): Payer: Medicaid Other

## 2022-05-21 DIAGNOSIS — Z111 Encounter for screening for respiratory tuberculosis: Secondary | ICD-10-CM

## 2022-05-21 LAB — TB SKIN TEST
Induration: 0 mm
TB Skin Test: NEGATIVE

## 2023-02-10 ENCOUNTER — Other Ambulatory Visit: Payer: Self-pay | Admitting: Internal Medicine

## 2023-02-10 DIAGNOSIS — Z1231 Encounter for screening mammogram for malignant neoplasm of breast: Secondary | ICD-10-CM

## 2023-03-17 ENCOUNTER — Ambulatory Visit
Admission: RE | Admit: 2023-03-17 | Discharge: 2023-03-17 | Disposition: A | Discharge status: Home / Self Care | Payer: Medicaid Other | Source: Ambulatory Visit | Attending: Internal Medicine | Admitting: Internal Medicine

## 2023-03-17 DIAGNOSIS — Z1231 Encounter for screening mammogram for malignant neoplasm of breast: Secondary | ICD-10-CM

## 2023-06-14 ENCOUNTER — Encounter: Payer: Self-pay | Admitting: Obstetrics and Gynecology

## 2023-06-14 ENCOUNTER — Other Ambulatory Visit (HOSPITAL_COMMUNITY)
Admission: RE | Admit: 2023-06-14 | Discharge: 2023-06-14 | Disposition: A | Discharge status: Home / Self Care | Payer: Medicaid Other | Source: Ambulatory Visit | Attending: Obstetrics and Gynecology | Admitting: Obstetrics and Gynecology

## 2023-06-14 ENCOUNTER — Ambulatory Visit (INDEPENDENT_AMBULATORY_CARE_PROVIDER_SITE_OTHER): Payer: Medicaid Other | Admitting: Obstetrics and Gynecology

## 2023-06-14 VITALS — BP 174/94 | HR 80 | Ht 66.0 in | Wt 262.0 lb

## 2023-06-14 DIAGNOSIS — Z01419 Encounter for gynecological examination (general) (routine) without abnormal findings: Secondary | ICD-10-CM

## 2023-06-14 DIAGNOSIS — R030 Elevated blood-pressure reading, without diagnosis of hypertension: Secondary | ICD-10-CM | POA: Diagnosis not present

## 2023-06-14 DIAGNOSIS — Z124 Encounter for screening for malignant neoplasm of cervix: Secondary | ICD-10-CM | POA: Insufficient documentation

## 2023-06-14 NOTE — Progress Notes (Signed)
Obstetrics and Gynecology New Patient Evaluation  Appointment Date: 06/14/2023  OBGYN Clinic: [facility]   Primary Care Provider: Marcelino Duster Mclean  Referring Provider: Gracelyn Nurse, MD  Chief Complaint:  Chief Complaint  Patient presents with   Annual Exam    History of Present Illness: Krista Mclean is a 63 y.o. Black [id number] (Patient's last menstrual period was 01/13/2014.), seen for the above chief complaint. Her past medical history is significant for c/s x 2 with BTL   No issues or concerns today.   Review of Systems: Pertinent items noted in HPI and remainder of comprehensive ROS otherwise negative.   Patient Active Problem List   Diagnosis Date Noted   Transient hypertension 06/14/2023   Vertigo 02/21/2019   Controlled type 2 diabetes mellitus without complication, without long-term current use of insulin (HCC) 10/06/2015   Morbid obesity with BMI of 40.0-44.9, adult (HCC) 09/08/2015   Pulmonary nodule 01/09/2014   Hypothyroid 12/01/2010   Obesity 12/01/2010   Plantar fasciitis 12/01/2010    Past Medical History:  Past Medical History:  Diagnosis Date   Abnormal Pap smear 10/07/1998   mild dysplasia/leep done   Diabetes mellitus without complication (HCC)    Hashimoto's disease    Morbid obesity (HCC)    Obesity    OSA (obstructive sleep apnea)    Pre-diabetes    Thyroid disease     Past Surgical History:  Past Surgical History:  Procedure Laterality Date   CESAREAN SECTION  1993   CESAREAN SECTION  03/02/2002   ELECTIVE   COLONOSCOPY WITH PROPOFOL N/A 04/07/2015   Procedure: COLONOSCOPY WITH PROPOFOL;  Surgeon: Wallace Cullens, MD;  Location: [facility] ENDOSCOPY;  Service: Gastroenterology;  Laterality: N/A;   COLONOSCOPY WITH PROPOFOL N/A 04/10/2018   Procedure: COLONOSCOPY WITH PROPOFOL;  Surgeon: Christena Deem, MD;  Location: [facility] ENDOSCOPY;  Service: Endoscopy;  Laterality: N/A;   ESOPHAGOGASTRODUODENOSCOPY (EGD) WITH  PROPOFOL N/A 04/07/2015   Procedure: ESOPHAGOGASTRODUODENOSCOPY (EGD) WITH PROPOFOL;  Surgeon: Wallace Cullens, MD;  Location: [facility] ENDOSCOPY;  Service: Gastroenterology;  Laterality: N/A;   LAPAROSCOPIC TUBAL LIGATION  03/02/2002   TONSILLECTOMY      Past Obstetrical History:  OB History  Gravida Para Term Preterm AB Living  2 2 2  0 0 2  SAB IAB Ectopic Multiple Live Births  0 0 0 0 2    # Outcome Date GA Lbr Len/2nd Weight Sex Type Anes PTL Lv  2 Term 03/02/02 [redacted]w[redacted]Mclean   F CS-LTranv  N LIV  1 Term 1993 [redacted]w[redacted]Mclean   M CS-Unspec  N LIV    Past Gynecological History: As per HPI. History of Pap Smear(s): Yes.   Last pap 2020, which was pap and HPV negative  History of HRT use: No. Patient is not sexually active  Social History:  Social History   Socioeconomic History   Marital status: Single    Spouse name: Not on file   Number of children: 2   Years of education: Not on file   Highest education level: High school graduate  Occupational History   Not on file  Tobacco Use   Smoking status: Never   Smokeless tobacco: Never  Vaping Use   Vaping status: Never Used  Substance and Sexual Activity   Alcohol use: No    Alcohol/week: 0.0 standard drinks of alcohol   Drug use: No   Sexual activity: Not Currently    Partners: Male    Birth control/protection: Surgical  Other  Topics Concern   Not on file  Social History Narrative   Not on file   Social Drivers of Health   Financial Resource Strain: Not on file  Food Insecurity: No Food Insecurity (03/15/2022)   Hunger Vital Sign    Worried About Running Out of Food in the Last Year: Never true    Ran Out of Food in the Last Year: Never true  Transportation Needs: No Transportation Needs (03/15/2022)   PRAPARE - Administrator, Civil Service (Medical): No    Lack of Transportation (Non-Medical): No  Physical Activity: Not on file  Stress: Not on file  Social Connections: Not on file  Intimate Partner Violence: Not on  file    Family History:  Family History  Problem Relation Age of Onset   Diabetes Mother    Arthritis Mother    Arthritis Father    Heart disease Son 35       CARDIO MYOPATHY   Diabetes Maternal Aunt    Diabetes Maternal Aunt    Thyroid disease Maternal Aunt    Diabetes Maternal Aunt    Hypertension Maternal Grandmother    Breast cancer Neg Hx    Health Maintenance:  Mammogram(s): Yes.   Date: 02/2023, bi-rads 1 Colonoscopy: Yes.   Date: 2019>>has repeat on 06/2023  Medications [name]. [name] had no medications administered during this visit. Current Outpatient Medications  Medication Sig Dispense Refill   levothyroxine (SYNTHROID, LEVOTHROID) 150 MCG tablet Take 150 mcg by mouth daily before breakfast.     ibuprofen (ADVIL,MOTRIN) 800 MG tablet Take 800 mg by mouth every 8 (eight) hours as needed. (Patient not taking: Reported on 06/14/2023)     loratadine (CLARITIN) 10 MG tablet Take 10 mg by mouth daily. (Patient not taking: Reported on 06/14/2023)     meclizine (ANTIVERT) 25 MG tablet Take 25 mg by mouth 3 (three) times daily as needed. (Patient not taking: Reported on 06/14/2023)     No current facility-administered medications for this visit.    Allergies Tramadol   Physical Exam:  BP (!) 174/94   Pulse 80   Ht 5\' 6"  (1.676 m)   Wt 262 lb (118.8 kg)   LMP 01/13/2014   BMI 42.29 kg/m  Body mass index is 42.29 kg/m. General appearance: Well nourished, well developed female in no acute distress.  Neck:  Supple, normal appearance, and no thyromegaly  Cardiovascular: normal s1 and s2.  No murmurs, rubs or gallops. Respiratory:  Clear to auscultation bilateral. Normal respiratory effort Abdomen: positive bowel sounds and no masses, hernias; diffusely non tender to palpation, non distended Breasts: breasts appear normal, no suspicious masses, no skin or nipple changes or axillary nodes, and normal palpation. Neuro/Psych:  Normal mood and affect.  Skin:  Warm and dry.   Lymphatic:  No inguinal lymphadenopathy.   Breast and Cervical exam performed in the presence of a chaperone Pelvic exam: is not limited by body habitus EGBUS: within normal limits, moderate atrophy Vagina: within normal limits and with no blood or discharge in the vault, moderate atrophy Cervix: normal appearing cervix without tenderness, discharge or lesions, moderate atrophy Uterus:  nonenlarged and non tender Adnexa:  normal adnexa and no mass, fullness, tenderness Rectovaginal: deferred  Laboratory: none  Radiology: none  Assessment: patient stable  Plan: 1. Cervical cancer screening (Primary) - Cytology - PAP  2. Well woman exam with routine gynecological exam No issues - Cytology - PAP  3. Transient hypertension Patient denies any h/o  of HTN. She states that she started ozempic and was told it may be cause HTN. ED precautions and I asked her to contact her PCP at [facility]; I'll also forward this note to him  RTC PRN   Cornelia Copa MD Attending Center [facility])

## 2023-06-14 NOTE — Progress Notes (Signed)
Patient presents for Annual.  LMP: postmenopausal  Last pap: Date: 2020-WNL Contraception: Post-menopausal Colonoscopy: 07/11/23 Mammogram: Up to date: 03/17/23 STD Screening: Declines Flu Vaccine : Declines

## 2023-06-16 LAB — CYTOLOGY - PAP
Adequacy: ABSENT
Comment: NEGATIVE
Diagnosis: NEGATIVE
High risk HPV: NEGATIVE

## 2023-06-21 ENCOUNTER — Encounter: Payer: Self-pay | Admitting: Obstetrics and Gynecology

## 2023-06-30 ENCOUNTER — Encounter: Payer: Self-pay | Admitting: Gastroenterology

## 2023-07-11 ENCOUNTER — Encounter: Payer: Self-pay | Admitting: Gastroenterology

## 2023-07-11 ENCOUNTER — Ambulatory Visit
Admission: RE | Admit: 2023-07-11 | Discharge: 2023-07-11 | Disposition: A | Discharge status: Home / Self Care | Payer: Medicaid Other | Source: Ambulatory Visit | Attending: Gastroenterology | Admitting: Gastroenterology

## 2023-07-11 ENCOUNTER — Other Ambulatory Visit: Payer: Self-pay | Admitting: Gastroenterology

## 2023-07-11 ENCOUNTER — Encounter
Admission: RE | Disposition: A | Discharge status: Home / Self Care | Payer: Self-pay | Source: Ambulatory Visit | Attending: Gastroenterology

## 2023-07-11 ENCOUNTER — Ambulatory Visit: Payer: Medicaid Other | Admitting: Anesthesiology

## 2023-07-11 ENCOUNTER — Other Ambulatory Visit: Payer: Self-pay

## 2023-07-11 DIAGNOSIS — I10 Essential (primary) hypertension: Secondary | ICD-10-CM | POA: Diagnosis not present

## 2023-07-11 DIAGNOSIS — Z83719 Family history of colon polyps, unspecified: Secondary | ICD-10-CM | POA: Diagnosis not present

## 2023-07-11 DIAGNOSIS — Q438 Other specified congenital malformations of intestine: Secondary | ICD-10-CM | POA: Diagnosis not present

## 2023-07-11 DIAGNOSIS — Z7985 Long-term (current) use of injectable non-insulin antidiabetic drugs: Secondary | ICD-10-CM | POA: Insufficient documentation

## 2023-07-11 DIAGNOSIS — E119 Type 2 diabetes mellitus without complications: Secondary | ICD-10-CM | POA: Insufficient documentation

## 2023-07-11 DIAGNOSIS — K5730 Diverticulosis of large intestine without perforation or abscess without bleeding: Secondary | ICD-10-CM | POA: Insufficient documentation

## 2023-07-11 DIAGNOSIS — K621 Rectal polyp: Secondary | ICD-10-CM | POA: Diagnosis not present

## 2023-07-11 DIAGNOSIS — K640 First degree hemorrhoids: Secondary | ICD-10-CM | POA: Diagnosis not present

## 2023-07-11 DIAGNOSIS — E039 Hypothyroidism, unspecified: Secondary | ICD-10-CM | POA: Insufficient documentation

## 2023-07-11 DIAGNOSIS — D123 Benign neoplasm of transverse colon: Secondary | ICD-10-CM | POA: Diagnosis not present

## 2023-07-11 DIAGNOSIS — Z1211 Encounter for screening for malignant neoplasm of colon: Secondary | ICD-10-CM | POA: Insufficient documentation

## 2023-07-11 HISTORY — DX: Solitary pulmonary nodule: R91.1

## 2023-07-11 HISTORY — DX: Hypothyroidism, unspecified: E03.9

## 2023-07-11 HISTORY — PX: COLONOSCOPY: SHX5424

## 2023-07-11 HISTORY — PX: POLYPECTOMY: SHX5525

## 2023-07-11 HISTORY — DX: Dizziness and giddiness: R42

## 2023-07-11 LAB — GLUCOSE, CAPILLARY: Glucose-Capillary: 104 mg/dL — ABNORMAL HIGH (ref 70–99)

## 2023-07-11 SURGERY — COLONOSCOPY
Anesthesia: General

## 2023-07-11 MED ORDER — PROPOFOL 10 MG/ML IV BOLUS
INTRAVENOUS | Status: DC | PRN
  Administered 2023-07-11: 100 mg via INTRAVENOUS

## 2023-07-11 MED ORDER — PROPOFOL 1000 MG/100ML IV EMUL
INTRAVENOUS | Status: AC
  Filled 2023-07-11: qty 100

## 2023-07-11 MED ORDER — PROPOFOL 500 MG/50ML IV EMUL
INTRAVENOUS | Status: DC | PRN
  Administered 2023-07-11: 150 ug/kg/min via INTRAVENOUS

## 2023-07-11 MED ORDER — SODIUM CHLORIDE 0.9 % IV SOLN: INTRAVENOUS | Status: DC

## 2023-07-11 NOTE — Anesthesia Postprocedure Evaluation (Signed)
 Anesthesia Post Note  Patient: Krista Mclean  Procedure(s) Performed: COLONOSCOPY POLYPECTOMY  Patient location during evaluation: PACU Anesthesia Type: General Level of consciousness: awake and alert Pain management: pain level controlled Vital Signs Assessment: post-procedure vital signs reviewed and stable Respiratory status: spontaneous breathing, nonlabored ventilation, respiratory function stable and patient connected to nasal cannula oxygen Cardiovascular status: blood pressure returned to baseline and stable Postop Assessment: no apparent nausea or vomiting Anesthetic complications: no   There were no known notable events for this encounter.   Last Vitals:  Vitals:   07/11/23 0837 07/11/23 1002  BP: (!) 171/90 (!) 113/58  Pulse: 84 78  Resp: 18 18  Temp: (!) 36.3 C (!) 36.1 C  SpO2: 100% 99%    Last Pain:  Vitals:   07/11/23 1002  TempSrc: Temporal  PainSc: Asleep                 Yevette Edwards

## 2023-07-11 NOTE — Anesthesia Preprocedure Evaluation (Signed)
 Anesthesia Evaluation  Patient identified by MRN, date of birth, ID band Patient awake    Reviewed: Allergy & Precautions, H&P , NPO status , Patient's Chart, lab work & pertinent test results, reviewed documented beta blocker date and time   Airway Mallampati: II   Neck ROM: full    Dental  (+) Poor Dentition   Pulmonary sleep apnea and Continuous Positive Airway Pressure Ventilation    Pulmonary exam normal        Cardiovascular Exercise Tolerance: Poor hypertension, On Medications Normal cardiovascular exam Rhythm:regular Rate:Normal     Neuro/Psych negative neurological ROS  negative psych ROS   GI/Hepatic negative GI ROS, Neg liver ROS,,,  Endo/Other  diabetes, Well ControlledHypothyroidism    Renal/GU negative Renal ROS  negative genitourinary   Musculoskeletal   Abdominal   Peds  Hematology negative hematology ROS (+)   Anesthesia Other Findings Past Medical History: 10/07/1998: Abnormal Pap smear     Comment:  mild dysplasia/leep done No date: Diabetes mellitus without complication (HCC) No date: Hashimoto's disease No date: Hypothyroidism No date: Morbid obesity (HCC) No date: Obesity No date: OSA (obstructive sleep apnea) No date: Pre-diabetes No date: Pulmonary nodule No date: Thyroid disease No date: Vertigo Past Surgical History: 05/18/1991: CESAREAN SECTION 03/02/2002: CESAREAN SECTION WITH BILATERAL TUBAL LIGATION     Comment:  ELECTIVE 04/07/2015: COLONOSCOPY WITH PROPOFOL; N/A     Comment:  Procedure: COLONOSCOPY WITH PROPOFOL;  Surgeon: Wallace Cullens, MD;  Location: [facility] ENDOSCOPY;  Service:               Gastroenterology;  Laterality: N/A; 04/10/2018: COLONOSCOPY WITH PROPOFOL; N/A     Comment:  Procedure: COLONOSCOPY WITH PROPOFOL;  Surgeon:               Christena Deem, MD;  Location: [facility] ENDOSCOPY;                Service: Endoscopy;  Laterality: N/A; 04/07/2015:  ESOPHAGOGASTRODUODENOSCOPY (EGD) WITH PROPOFOL; N/A     Comment:  Procedure: ESOPHAGOGASTRODUODENOSCOPY (EGD) WITH               PROPOFOL;  Surgeon: Wallace Cullens, MD;  Location: [facility]               ENDOSCOPY;  Service: Gastroenterology;  Laterality: N/A; No date: TONSILLECTOMY   Reproductive/Obstetrics negative OB ROS                             Anesthesia Physical Anesthesia Plan  ASA: 3  Anesthesia Plan: General   Post-op Pain Management:    Induction:   PONV Risk Score and Plan:   Airway Management Planned:   Additional Equipment:   Intra-op Plan:   Post-operative Plan:   Informed Consent: I have reviewed the patients History and Physical, chart, labs and discussed the procedure including the risks, benefits and alternatives for the proposed anesthesia with the patient or authorized representative who has indicated his/her understanding and acceptance.     Dental Advisory Given  Plan Discussed with: CRNA  Anesthesia Plan Comments:        Anesthesia Quick Evaluation

## 2023-07-11 NOTE — Interval H&P Note (Signed)
 History and Physical Interval Note: Preprocedure H&P from 07/11/23  was reviewed and there was no interval change after seeing and examining the patient.  Written consent was obtained from the patient after discussion of risks, benefits, and alternatives. Patient has consented to proceed with Colonoscopy with possible intervention   07/11/2023 9:14 AM  [name]  has presented today for surgery, with the diagnosis of Personal history of adenomatous and serrated colon polyps (Z86.0101) Family hx colonic polyps (Z83.719).  The various methods of treatment have been discussed with the patient and family. After consideration of risks, benefits and other options for treatment, the patient has consented to  Procedure(s): COLONOSCOPY (N/A) as a surgical intervention.  The patient's history has been reviewed, patient examined, no change in status, stable for surgery.  I have reviewed the patient's chart and labs.  Questions were answered to the patient's satisfaction.     Krista Mclean

## 2023-07-11 NOTE — H&P (Signed)
 Pre-Procedure H&P   Patient ID: Krista Mclean is a 63 y.o. female.  Gastroenterology Provider: Jaynie Collins, DO  Referring Provider: Dr. Letitia Libra PCP: Gracelyn Nurse, MD  Date: 07/11/2023  HPI Ms. Krista Mclean is a 63 y.o. female who presents today for Colonoscopy for personal and family history of colon polyps .  Patient last underwent colonoscopy November 2019 demonstrating adenomatous and sessile serrated polyps.  Per notation, Prep was limited at that time. she also had adenomatous polyp in 2016.  Reports regular bowel movements without melena or hematochezia Mother- colon polyps  Hemoglobin 11.7 MCV 83 platelets 250,000 creatinine 1.0 On Ozempic which has been held for this procedure- last dose 07/03/23   Past Medical History:  Diagnosis Date   Abnormal Pap smear 10/07/1998   mild dysplasia/leep done   Diabetes mellitus without complication (HCC)    Hashimoto's disease    Hypothyroidism    Morbid obesity (HCC)    Obesity    OSA (obstructive sleep apnea)    Pre-diabetes    Pulmonary nodule    Thyroid disease    Vertigo     Past Surgical History:  Procedure Laterality Date   CESAREAN SECTION  05/18/1991   CESAREAN SECTION WITH BILATERAL TUBAL LIGATION  03/02/2002   ELECTIVE   COLONOSCOPY WITH PROPOFOL N/A 04/07/2015   Procedure: COLONOSCOPY WITH PROPOFOL;  Surgeon: Wallace Cullens, MD;  Location: [facility] ENDOSCOPY;  Service: Gastroenterology;  Laterality: N/A;   COLONOSCOPY WITH PROPOFOL N/A 04/10/2018   Procedure: COLONOSCOPY WITH PROPOFOL;  Surgeon: Christena Deem, MD;  Location: [facility] ENDOSCOPY;  Service: Endoscopy;  Laterality: N/A;   ESOPHAGOGASTRODUODENOSCOPY (EGD) WITH PROPOFOL N/A 04/07/2015   Procedure: ESOPHAGOGASTRODUODENOSCOPY (EGD) WITH PROPOFOL;  Surgeon: Wallace Cullens, MD;  Location: [facility] ENDOSCOPY;  Service: Gastroenterology;  Laterality: N/A;   TONSILLECTOMY      Family History Mother- colon polyps No other h/o GI disease or  malignancy  Review of Systems  Constitutional:  Negative for activity change, appetite change, chills, diaphoresis, fatigue, fever and unexpected weight change.  HENT:  Negative for trouble swallowing and voice change.   Respiratory:  Negative for shortness of breath and wheezing.   Cardiovascular:  Negative for chest pain, palpitations and leg swelling.  Gastrointestinal:  Negative for abdominal distention, abdominal pain, anal bleeding, blood in stool, constipation, diarrhea, nausea, rectal pain and vomiting.  Musculoskeletal:  Negative for arthralgias and myalgias.  Skin:  Negative for color change and pallor.  Neurological:  Negative for dizziness, syncope and weakness.  Psychiatric/Behavioral:  Negative for confusion.   All other systems reviewed and are negative.    Medications No current facility-administered medications on file prior to encounter.   Current Outpatient Medications on File Prior to Encounter  Medication Sig Dispense Refill   furosemide (LASIX) 20 MG tablet Take 20 mg by mouth daily. Pt takes prn     potassium chloride (KLOR-CON) 10 MEQ tablet Take 10 mEq by mouth daily. Pt takes prn     Semaglutide (OZEMPIC, 0.25 OR 0.5 MG/DOSE, [place]) Inject into the skin.     ibuprofen (ADVIL,MOTRIN) 800 MG tablet Take 800 mg by mouth every 8 (eight) hours as needed. (Patient not taking: Reported on 06/14/2023)     levothyroxine (SYNTHROID, LEVOTHROID) 150 MCG tablet Take 150 mcg by mouth daily before breakfast.     loratadine (CLARITIN) 10 MG tablet Take 10 mg by mouth daily. (Patient not taking: Reported on 06/14/2023)     meclizine (ANTIVERT) 25 MG tablet  Take 25 mg by mouth 3 (three) times daily as needed. (Patient not taking: Reported on 06/14/2023)      Pertinent medications related to GI and procedure were reviewed by me with the patient prior to the procedure   Current Facility-Administered Medications:    0.9 %  sodium chloride infusion, , Intravenous, Continuous, Jaynie Collins, DO, Last Rate: 20 mL/hr at 07/11/23 0856, New Bag at 07/11/23 0856  sodium chloride 20 mL/hr at 07/11/23 9629       Allergies  Allergen Reactions   Tramadol Nausea Only and Nausea And Vomiting   Allergies were reviewed by me prior to the procedure  Objective   Body mass index is 41.84 kg/m. Vitals:   07/11/23 0837  BP: (!) 171/90  Pulse: 84  Resp: 18  Temp: (!) 97.3 F (36.3 C)  TempSrc: Temporal  SpO2: 100%  Weight: 117.6 kg  Height: 5\' 6"  (1.676 m)     Physical Exam Vitals and nursing note reviewed.  Constitutional:      General: She is not in acute distress.    Appearance: Normal appearance. She is obese. She is not ill-appearing, toxic-appearing or diaphoretic.  HENT:     Head: Normocephalic and atraumatic.     Nose: Nose normal.     Mouth/Throat:     Mouth: Mucous membranes are moist.     Pharynx: Oropharynx is clear.  Eyes:     General: No scleral icterus.    Extraocular Movements: Extraocular movements intact.  Cardiovascular:     Rate and Rhythm: Normal rate and regular rhythm.     Heart sounds: Normal heart sounds. No murmur heard.    No friction rub. No gallop.  Pulmonary:     Effort: Pulmonary effort is normal. No respiratory distress.     Breath sounds: Normal breath sounds. No wheezing, rhonchi or rales.  Abdominal:     General: Bowel sounds are normal. There is no distension.     Palpations: Abdomen is soft.     Tenderness: There is no abdominal tenderness. There is no guarding or rebound.  Musculoskeletal:     Cervical back: Neck supple.     Right lower leg: No edema.     Left lower leg: No edema.  Skin:    General: Skin is warm and dry.     Coloration: Skin is not jaundiced or pale.  Neurological:     General: No focal deficit present.     Mental Status: She is alert and oriented to person, place, and time. Mental status is at baseline.  Psychiatric:        Mood and Affect: Mood normal.        Behavior: Behavior normal.         Thought Content: Thought content normal.        Judgment: Judgment normal.      Assessment:  Ms. Krista Mclean is a 63 y.o. female  who presents today for Colonoscopy for personal and family history of colon polyps .  Plan:  Colonoscopy with possible intervention today  Colonoscopy with possible biopsy, control of bleeding, polypectomy, and interventions as necessary has been discussed with the patient/patient representative. Informed consent was obtained from the patient/patient representative after explaining the indication, nature, and risks of the procedure including but not limited to death, bleeding, perforation, missed neoplasm/lesions, cardiorespiratory compromise, and reaction to medications. Opportunity for questions was given and appropriate answers were provided. Patient/patient representative has verbalized understanding is amenable to undergoing  the procedure.   Jaynie Collins, DO  [facility] Gastroenterology  Portions of the record may have been created with voice recognition software. Occasional wrong-word or 'sound-a-like' substitutions may have occurred due to the inherent limitations of voice recognition software.  Read the chart carefully and recognize, using context, where substitutions may have occurred.

## 2023-07-11 NOTE — Op Note (Signed)
 [facility] Gastroenterology Patient Name: Krista Mclean Procedure Date: 07/11/2023 9:16 AM MRN: [id number] Account #: [id number] Date of Birth: 10-22-1960 Admit Type: Outpatient Age: 63 Room: [facility] ENDO ROOM 2 Gender: Female Note Status: Finalized Instrument Name: Colonscope [id number] Procedure:             Colonoscopy Indications:           High risk colon cancer surveillance: Personal history                         of colonic polyps Providers:             Trenda Moots, DO Referring MD:          Craig Guess, MD (Referring MD) Medicines:             Monitored Anesthesia Care Complications:         No immediate complications. Estimated blood loss:                         Minimal. Procedure:             Pre-Anesthesia Assessment:                        - Prior to the procedure, a History and Physical was                         performed, and patient medications and allergies were                         reviewed. The patient is competent. The risks and                         benefits of the procedure and the sedation options and                         risks were discussed with the patient. All questions                         were answered and informed consent was obtained.                         Patient identification and proposed procedure were                         verified by the physician, the nurse, the anesthetist                         and the technician in the endoscopy suite. Mental                         Status Examination: alert and oriented. Airway                         Examination: normal oropharyngeal airway and neck                         mobility. Respiratory Examination: clear to  auscultation. CV Examination: RRR, no murmurs, no S3                         or S4. Prophylactic Antibiotics: The patient does not                         require prophylactic antibiotics. Prior                          Anticoagulants: The patient has taken no anticoagulant                         or antiplatelet agents. ASA Grade Assessment: III - A                         patient with severe systemic disease. After reviewing                         the risks and benefits, the patient was deemed in                         satisfactory condition to undergo the procedure. The                         anesthesia plan was to use monitored anesthesia care                         (MAC). Immediately prior to administration of                         medications, the patient was re-assessed for adequacy                         to receive sedatives. The heart rate, respiratory                         rate, oxygen saturations, blood pressure, adequacy of                         pulmonary ventilation, and response to care were                         monitored throughout the procedure. The physical                         status of the patient was re-assessed after the                         procedure.                        After obtaining informed consent, the colonoscope was                         passed under direct vision. Throughout the procedure,                         the patient's blood pressure, pulse, and oxygen  saturations were monitored continuously. The                         Colonoscope was introduced through the anus and                         advanced to the the cecum, identified by appendiceal                         orifice and ileocecal valve. The colonoscopy was                         somewhat difficult due to a redundant colon and the                         patient's body habitus. Successful completion of the                         procedure was aided by straightening and shortening                         the scope to obtain bowel loop reduction, using scope                         torsion, applying abdominal pressure and lavage. The                          patient tolerated the procedure well. The quality of                         the bowel preparation was evaluated using the BBPS                         [facility] Bowel Preparation Scale) with scores of: Right                         Colon = 2 (minor amount of residual staining, small                         fragments of stool and/or opaque liquid, but mucosa                         seen well), Transverse Colon = 3 (entire mucosa seen                         well with no residual staining, small fragments of                         stool or opaque liquid) and Left Colon = 3 (entire                         mucosa seen well with no residual staining, small                         fragments of stool or opaque liquid). The total BBPS  score equals 8. The quality of the bowel preparation                         was excellent. The ileocecal valve, appendiceal                         orifice, and rectum were photographed. Findings:      The perianal and digital rectal examinations were normal. Pertinent       negatives include normal sphincter tone.      Two sessile polyps were found in the transverse colon. The polyps were 3       to 4 mm in size. These polyps were removed with a cold snare. Resection       and retrieval were complete. Estimated blood loss was minimal.      Three sessile polyps were found in the rectum. The polyps were 1 to 2 mm       in size. These polyps were removed with a jumbo cold forceps. Resection       and retrieval were complete. Estimated blood loss was minimal.      Multiple small-mouthed diverticula were found in the entire colon.       Estimated blood loss: none.      Non-bleeding internal hemorrhoids were found during retroflexion. The       hemorrhoids were Grade I (internal hemorrhoids that do not prolapse).       Estimated blood loss: none. Impression:            - Two 3 to 4 mm polyps in the transverse colon,                          removed with a cold snare. Resected and retrieved.                        - Three 1 to 2 mm polyps in the rectum, removed with a                         jumbo cold forceps. Resected and retrieved.                        - Diverticulosis in the entire examined colon.                        - Non-bleeding internal hemorrhoids. Recommendation:        - Patient has a contact number available for                         emergencies. The signs and symptoms of potential                         delayed complications were discussed with the patient.                         Return to normal activities tomorrow. Written                         discharge instructions were provided to the patient.                        -  Discharge patient to home.                        - Resume previous diet.                        - Continue present medications.                        - No ibuprofen, naproxen, or other non-steroidal                         anti-inflammatory drugs for 5 days after polyp removal.                        - Await pathology results.                        - Repeat colonoscopy for surveillance based on                         pathology results.                        - Return to referring physician as previously                         scheduled.                        - The findings and recommendations were discussed with                         the patient. Procedure Code(s):     --- Professional ---                        [phone], Colonoscopy, flexible; with removal of                         tumor(s), polyp(s), or other lesion(s) by snare                         technique                        45380, 59, Colonoscopy, flexible; with biopsy, single                         or multiple Diagnosis Code(s):     --- Professional ---                        Z86.010, Personal history of colonic polyps                        D12.3, Benign neoplasm of transverse colon (hepatic                          flexure or splenic flexure)                        D12.8, Benign neoplasm of rectum  K64.0, First degree hemorrhoids                        K57.30, Diverticulosis of large intestine without                         perforation or abscess without bleeding CPT copyright 2022 [employer]. All rights reserved. The codes documented in this report are preliminary and upon coder review may  be revised to meet current compliance requirements. Attending Participation:      I personally performed the entire procedure. Elfredia Nevins, DO Jaynie Collins DO, DO 07/11/2023 10:04:51 AM This report has been signed electronically. Number of Addenda: 0 Note Initiated On: 07/11/2023 9:16 AM Scope Withdrawal Time: 0 hours 10 minutes 44 seconds  Total Procedure Duration: 0 hours 34 minutes 51 seconds  Estimated Blood Loss:  Estimated blood loss was minimal.      [facility]

## 2023-07-11 NOTE — Transfer of Care (Signed)
 Immediate Anesthesia Transfer of Care Note  Patient: Krista Mclean  Procedure(s) Performed: COLONOSCOPY POLYPECTOMY  Patient Location: PACU  Anesthesia Type:General  Level of Consciousness: awake and sedated  Airway & Oxygen Therapy: Patient Spontanous Breathing and Patient connected to nasal cannula oxygen  Post-op Assessment: Report given to RN and Post -op Vital signs reviewed and stable  Post vital signs: Reviewed and stable  Last Vitals:  Vitals Value Taken Time  BP    Temp    Pulse    Resp    SpO2      Last Pain:  Vitals:   07/11/23 0837  TempSrc: Temporal  PainSc: 0-No pain         Complications: There were no known notable events for this encounter.

## 2023-07-12 ENCOUNTER — Encounter: Payer: Self-pay | Admitting: Gastroenterology

## 2023-07-12 LAB — SURGICAL PATHOLOGY

## 2024-02-13 ENCOUNTER — Other Ambulatory Visit: Payer: Self-pay | Admitting: Internal Medicine

## 2024-02-13 DIAGNOSIS — Z1231 Encounter for screening mammogram for malignant neoplasm of breast: Secondary | ICD-10-CM

## 2024-03-19 ENCOUNTER — Ambulatory Visit
Admission: RE | Admit: 2024-03-19 | Discharge: 2024-03-19 | Disposition: A | Discharge status: Home / Self Care | Source: Ambulatory Visit | Attending: Internal Medicine | Admitting: Internal Medicine

## 2024-03-19 DIAGNOSIS — Z1231 Encounter for screening mammogram for malignant neoplasm of breast: Secondary | ICD-10-CM | POA: Insufficient documentation
